# Patient Record
Sex: Female | Born: 1965 | Race: White | Hispanic: No | Marital: Married | Smoking: Never smoker
Health system: Southern US, Community
[De-identification: ages and names within clinical notes are randomized; demographics above are authoritative.]

## PROBLEM LIST (undated history)

## (undated) DIAGNOSIS — Z8742 Personal history of other diseases of the female genital tract: Secondary | ICD-10-CM

## (undated) DIAGNOSIS — D352 Benign neoplasm of pituitary gland: Secondary | ICD-10-CM

## (undated) DIAGNOSIS — N200 Calculus of kidney: Secondary | ICD-10-CM

## (undated) DIAGNOSIS — N920 Excessive and frequent menstruation with regular cycle: Secondary | ICD-10-CM

## (undated) DIAGNOSIS — K5792 Diverticulitis of intestine, part unspecified, without perforation or abscess without bleeding: Secondary | ICD-10-CM

## (undated) DIAGNOSIS — E221 Hyperprolactinemia: Secondary | ICD-10-CM

## (undated) HISTORY — DX: Calculus of kidney: N20.0

## (undated) HISTORY — DX: Diverticulitis of intestine, part unspecified, without perforation or abscess without bleeding: K57.92

---

## 1999-01-31 ENCOUNTER — Inpatient Hospital Stay (HOSPITAL_COMMUNITY): Admission: AD | Admit: 1999-01-31 | Discharge: 1999-01-31 | Payer: Self-pay | Admitting: *Deleted

## 1999-02-04 ENCOUNTER — Inpatient Hospital Stay (HOSPITAL_COMMUNITY): Admission: AD | Admit: 1999-02-04 | Discharge: 1999-02-06 | Payer: Self-pay | Admitting: *Deleted

## 2000-07-30 ENCOUNTER — Other Ambulatory Visit: Admission: RE | Admit: 2000-07-30 | Discharge: 2000-07-30 | Payer: Self-pay | Admitting: *Deleted

## 2001-08-04 ENCOUNTER — Other Ambulatory Visit: Admission: RE | Admit: 2001-08-04 | Discharge: 2001-08-04 | Payer: Self-pay | Admitting: *Deleted

## 2002-08-24 ENCOUNTER — Other Ambulatory Visit: Admission: RE | Admit: 2002-08-24 | Discharge: 2002-08-24 | Payer: Self-pay | Admitting: *Deleted

## 2003-11-01 ENCOUNTER — Other Ambulatory Visit: Admission: RE | Admit: 2003-11-01 | Discharge: 2003-11-01 | Payer: Self-pay | Admitting: *Deleted

## 2005-02-14 ENCOUNTER — Other Ambulatory Visit: Admission: RE | Admit: 2005-02-14 | Discharge: 2005-02-14 | Payer: Self-pay | Admitting: Obstetrics and Gynecology

## 2008-02-16 ENCOUNTER — Encounter: Payer: Self-pay | Admitting: Endocrinology

## 2008-06-25 ENCOUNTER — Encounter: Payer: Self-pay | Admitting: Endocrinology

## 2008-07-06 ENCOUNTER — Ambulatory Visit: Payer: Self-pay | Admitting: Endocrinology

## 2008-07-06 DIAGNOSIS — R51 Headache: Secondary | ICD-10-CM

## 2008-07-06 DIAGNOSIS — D352 Benign neoplasm of pituitary gland: Secondary | ICD-10-CM | POA: Insufficient documentation

## 2008-07-06 DIAGNOSIS — R87619 Unspecified abnormal cytological findings in specimens from cervix uteri: Secondary | ICD-10-CM

## 2008-07-06 DIAGNOSIS — E221 Hyperprolactinemia: Secondary | ICD-10-CM | POA: Insufficient documentation

## 2008-07-06 DIAGNOSIS — D353 Benign neoplasm of craniopharyngeal duct: Secondary | ICD-10-CM

## 2008-07-06 DIAGNOSIS — R519 Headache, unspecified: Secondary | ICD-10-CM | POA: Insufficient documentation

## 2008-07-06 LAB — CONVERTED CEMR LAB: TSH: 2.27 microintl units/mL (ref 0.35–5.50)

## 2008-07-09 ENCOUNTER — Encounter: Admission: RE | Admit: 2008-07-09 | Discharge: 2008-07-09 | Payer: Self-pay | Admitting: Endocrinology

## 2008-09-17 ENCOUNTER — Telehealth (INDEPENDENT_AMBULATORY_CARE_PROVIDER_SITE_OTHER): Payer: Self-pay | Admitting: *Deleted

## 2008-09-17 ENCOUNTER — Ambulatory Visit: Payer: Self-pay | Admitting: Endocrinology

## 2008-09-17 LAB — CONVERTED CEMR LAB: Prolactin: 47.4 ng/mL

## 2008-10-14 ENCOUNTER — Ambulatory Visit: Payer: Self-pay | Admitting: Endocrinology

## 2009-01-25 ENCOUNTER — Telehealth: Payer: Self-pay | Admitting: Endocrinology

## 2009-04-11 ENCOUNTER — Telehealth (INDEPENDENT_AMBULATORY_CARE_PROVIDER_SITE_OTHER): Payer: Self-pay | Admitting: *Deleted

## 2009-04-28 ENCOUNTER — Ambulatory Visit: Payer: Self-pay | Admitting: Endocrinology

## 2009-04-28 LAB — CONVERTED CEMR LAB: Prolactin: 29.3 ng/mL

## 2009-08-26 ENCOUNTER — Telehealth: Payer: Self-pay | Admitting: Endocrinology

## 2009-09-12 ENCOUNTER — Telehealth: Payer: Self-pay | Admitting: Endocrinology

## 2010-05-18 ENCOUNTER — Ambulatory Visit (HOSPITAL_BASED_OUTPATIENT_CLINIC_OR_DEPARTMENT_OTHER): Admission: RE | Admit: 2010-05-18 | Discharge: 2010-05-18 | Payer: Self-pay | Admitting: Surgery

## 2010-05-18 HISTORY — PX: UMBILICAL HERNIA REPAIR: SHX196

## 2010-09-10 ENCOUNTER — Encounter: Payer: Self-pay | Admitting: Endocrinology

## 2010-09-19 NOTE — Progress Notes (Signed)
Summary: Bromocriptine request  Phone Note Call from Patient Call back at Home Phone [phone]   Summary of Call: Patient left message on triage that she has new insurance and would like prescription for Bromocriptine sent to mail order [phone]). Patient was last seen in sept 2010 and told to f/u in one year. Ok to send? Initial call taken by: Lucious Groves,  August 26, 2009 9:58 AM  Follow-up for Phone Call        please refill x 1 year, 3 mos at a time.  please find out which mail order place this is, so we can send via emr. Follow-up by: Minus Breeding MD,  August 26, 2009 10:42 AM  Additional Follow-up for Phone Call Additional follow up Details #1::        Left message on machine to call back to office. Additional Follow-up by: Lucious Groves,  August 26, 2009 11:16 AM    Additional Follow-up for Phone Call Additional follow up Details #2::    Called patient home and was notified that the patient is on a cruise and will not be back until this friday. RX printed and awaiting signature in file folder at [employer] (MD is gone for the nesxt two weeks) Follow-up by: Lucious Groves,  August 29, 2009 10:22 AM  Additional Follow-up for Phone Call Additional follow up Details #3:: Details for Additional Follow-up Action Taken: i signed Additional Follow-up by: Minus Breeding MD,  September 12, 2009 12:54 PM  Prescriptions: PARLODEL 5 MG CAPS (BROMOCRIPTINE MESYLATE) bid  #180 x 3   Entered by:   Lucious Groves   Authorized by:   Minus Breeding MD   Signed by:   Lucious Groves on 08/29/2009   Method used:   Printed then faxed to ...       [facility]  [place] [address] (retail)       [address]       [place], [place]  [address]       Ph: [phone] or [phone]       Fax: [phone]   RxID:   [id number]   Appended Document: Bromocriptine request Rx was signed by MD. Arneta Cliche.

## 2010-09-19 NOTE — Progress Notes (Signed)
Summary: Parlodel RX  Phone Note Outgoing Call   Summary of Call: PArlodel prescription did not go successfully. [phone] is new prescription phone line for [facility]. Found in EMR and re-faxed electronically. Initial call taken by: Lucious Groves,  September 12, 2009 2:52 PM    New/Updated Medications: PARLODEL 5 MG CAPS (BROMOCRIPTINE MESYLATE) bid Prescriptions: PARLODEL 5 MG CAPS (BROMOCRIPTINE MESYLATE) bid  #180 x 3   Entered by:   Lucious Groves   Authorized by:   Minus Breeding MD   Signed by:   Lucious Groves on 09/12/2009   Method used:   Re-Faxed to ...       [facility] ([facility]order)       [address] [place], [place]  [address]       Ph: [phone]       Fax: [phone]   RxID:   [id number]

## 2010-11-02 LAB — DIFFERENTIAL
Basophils Relative: 1 % (ref 0–1)
Monocytes Relative: 6 % (ref 3–12)
Neutro Abs: 3.2 10*3/uL (ref 1.7–7.7)
Neutrophils Relative %: 55 % (ref 43–77)

## 2010-11-02 LAB — BASIC METABOLIC PANEL
Calcium: 8.9 mg/dL (ref 8.4–10.5)
Creatinine, Ser: 0.8 mg/dL (ref 0.4–1.2)
GFR calc non Af Amer: >60 mL/min (ref >60)
Glucose, Bld: 89 mg/dL (ref 70–99)
Sodium: 139 mEq/L (ref 135–145)

## 2010-11-02 LAB — CBC
Hemoglobin: 12.6 g/dL (ref 12.0–15.0)
MCHC: 33.6 g/dL (ref 30.0–36.0)
Platelets: 243 10*3/uL (ref 150–400)
RBC: 4.17 MIL/uL (ref 3.87–5.11)

## 2010-12-20 ENCOUNTER — Other Ambulatory Visit: Payer: Self-pay | Admitting: Endocrinology

## 2010-12-22 ENCOUNTER — Other Ambulatory Visit: Payer: Self-pay

## 2010-12-22 NOTE — Telephone Encounter (Signed)
A user error has taken place: encounter opened in error, closed for administrative reasons.

## 2010-12-29 ENCOUNTER — Ambulatory Visit: Payer: Self-pay | Admitting: Endocrinology

## 2011-01-03 ENCOUNTER — Ambulatory Visit (INDEPENDENT_AMBULATORY_CARE_PROVIDER_SITE_OTHER): Payer: BC Managed Care – PPO | Admitting: Endocrinology

## 2011-01-03 ENCOUNTER — Other Ambulatory Visit (INDEPENDENT_AMBULATORY_CARE_PROVIDER_SITE_OTHER): Payer: BC Managed Care – PPO

## 2011-01-03 ENCOUNTER — Encounter: Payer: Self-pay | Admitting: Endocrinology

## 2011-01-03 DIAGNOSIS — Z79899 Other long term (current) drug therapy: Secondary | ICD-10-CM | POA: Insufficient documentation

## 2011-01-03 DIAGNOSIS — E229 Hyperfunction of pituitary gland, unspecified: Secondary | ICD-10-CM

## 2011-01-03 DIAGNOSIS — D353 Benign neoplasm of craniopharyngeal duct: Secondary | ICD-10-CM

## 2011-01-03 DIAGNOSIS — D352 Benign neoplasm of pituitary gland: Secondary | ICD-10-CM

## 2011-01-03 LAB — TSH: TSH: 0.85 u[IU]/mL (ref 0.35–5.50)

## 2011-01-03 LAB — BASIC METABOLIC PANEL
CO2: 25 mEq/L (ref 19–32)
Calcium: 9.4 mg/dL (ref 8.4–10.5)
Creatinine, Ser: 0.7 mg/dL (ref 0.4–1.2)
GFR: 101.86 mL/min (ref >60.00)
Sodium: 137 mEq/L (ref 135–145)

## 2011-01-03 LAB — PROLACTIN: Prolactin: 50.7 ng/mL

## 2011-01-03 NOTE — Progress Notes (Signed)
  Subjective:    Patient ID: Tina Martin, female    DOB: Feb 01, 1966, 45 y.o.   MRN: [id number]  HPI Pt returns for f/u of pituitary adenoma and hyperprolactinemia (uncertain relationship between the two).  pt states she feels well in general.  She has reg menses.  She ran out of parlodel 1 week ago.   Past Medical History  Diagnosis Date  . PITUITARY ADENOMA 07/06/2008  . HYPERPROLACTINEMIA 07/06/2008  . Headache 07/06/2008  . PAP SMEAR, ABNORMAL 07/06/2008    No past surgical history on file.  History   Social History  . Marital Status: Married    Spouse Name: N/A    Number of Children: N/A  . Years of Education: N/A   Occupational History  . Facilities manager   Social History Main Topics  . Smoking status: Never Smoker   . Smokeless tobacco: Not on file  . Alcohol Use: Not on file  . Drug Use: Not on file  . Sexually Active: Not on file   Other Topics Concern  . 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    No Known Allergies  No family history on file.  BP 118/76  Pulse 62  Temp(Src) 98 F (36.7 C) (Oral)  Ht 5\' 3"  (1.6 m)  Wt 134 lb (60.782 kg)  BMI 23.74 kg/m2  SpO2 97%   Review of Systems Denies headache.    Objective:   Physical Exam GENERAL: no distress Neck - No masses or thyromegaly or limitation in range of motion.    Prolactin=50   Assessment & Plan:  Hyperprolactinemia, slightly higher off parlodel. Pituitary adenoma, small

## 2011-01-03 NOTE — Patient Instructions (Addendum)
blood tests and a repeat mri, are being ordered for you today.  please call [phone] to hear each of your test results.  You will be prompted to enter the 9-digit "MRN" number that appears at the top left of this page, followed by #.  Then you will hear the message. Why don't you stay-off the bromocriptine for now.  Please call if you have symptoms (no menstruation, or leakage from the breasts). Please return in 1 year (update: i left message on phone-tree:  rx as we discussed)

## 2011-01-08 ENCOUNTER — Other Ambulatory Visit: Payer: BC Managed Care – PPO

## 2011-01-10 ENCOUNTER — Ambulatory Visit
Admission: RE | Admit: 2011-01-10 | Discharge: 2011-01-10 | Disposition: A | Discharge status: Home / Self Care | Payer: BC Managed Care – PPO | Source: Ambulatory Visit | Attending: Endocrinology | Admitting: Endocrinology

## 2011-01-10 DIAGNOSIS — D352 Benign neoplasm of pituitary gland: Secondary | ICD-10-CM

## 2011-01-10 MED ORDER — GADOBENATE DIMEGLUMINE 529 MG/ML IV SOLN
9.0000 mL | Freq: Once | INTRAVENOUS | Status: AC | PRN
  Administered 2011-01-10: 9 mL via INTRAVENOUS

## 2011-01-11 ENCOUNTER — Other Ambulatory Visit: Payer: BC Managed Care – PPO

## 2011-02-19 ENCOUNTER — Telehealth: Payer: Self-pay

## 2011-02-19 MED ORDER — BROMOCRIPTINE MESYLATE 2.5 MG PO TABS: 2.5000 mg | ORAL_TABLET | Freq: Two times a day (BID) | ORAL | Status: DC

## 2011-02-19 NOTE — Telephone Encounter (Signed)
Pt informed of Rx/pharmacy 

## 2011-02-19 NOTE — Telephone Encounter (Signed)
Pt called stating she has been experiencing sxs since being off of Bromocriptine and would like to re-start medication, please advise.

## 2011-02-19 NOTE — Telephone Encounter (Signed)
i sent rx for 2.5 mg bid.  To avoid side-effects (nausea, dizziness), start 1/2 tab qhs x a few days.  Then 1 tab qhs, then 1 tab bid).

## 2011-06-15 ENCOUNTER — Other Ambulatory Visit: Payer: Self-pay | Admitting: Obstetrics and Gynecology

## 2011-06-15 DIAGNOSIS — R928 Other abnormal and inconclusive findings on diagnostic imaging of breast: Secondary | ICD-10-CM

## 2011-07-02 ENCOUNTER — Ambulatory Visit
Admission: RE | Admit: 2011-07-02 | Discharge: 2011-07-02 | Disposition: A | Discharge status: Home / Self Care | Payer: BC Managed Care – PPO | Source: Ambulatory Visit | Attending: Obstetrics and Gynecology | Admitting: Obstetrics and Gynecology

## 2011-07-02 DIAGNOSIS — R928 Other abnormal and inconclusive findings on diagnostic imaging of breast: Secondary | ICD-10-CM

## 2012-03-13 ENCOUNTER — Encounter: Payer: Self-pay | Admitting: Endocrinology

## 2012-03-13 ENCOUNTER — Ambulatory Visit (INDEPENDENT_AMBULATORY_CARE_PROVIDER_SITE_OTHER): Payer: BC Managed Care – PPO | Admitting: Endocrinology

## 2012-03-13 ENCOUNTER — Other Ambulatory Visit (INDEPENDENT_AMBULATORY_CARE_PROVIDER_SITE_OTHER): Payer: BC Managed Care – PPO

## 2012-03-13 VITALS — BP 102/72 | HR 71 | Temp 97.3°F | Wt 140.0 lb

## 2012-03-13 DIAGNOSIS — E229 Hyperfunction of pituitary gland, unspecified: Secondary | ICD-10-CM

## 2012-03-13 DIAGNOSIS — Z79899 Other long term (current) drug therapy: Secondary | ICD-10-CM

## 2012-03-13 LAB — BASIC METABOLIC PANEL
CO2: 27 mEq/L (ref 19–32)
Calcium: 9.3 mg/dL (ref 8.4–10.5)
Creatinine, Ser: 0.7 mg/dL (ref 0.4–1.2)
GFR: 90.33 mL/min (ref >60.00)
Sodium: 138 mEq/L (ref 135–145)

## 2012-03-13 LAB — HEPATIC FUNCTION PANEL
Alkaline Phosphatase: 48 U/L (ref 39–117)
Bilirubin, Direct: 0 mg/dL (ref 0.0–0.3)
Total Bilirubin: 0.4 mg/dL (ref 0.3–1.2)

## 2012-03-13 LAB — PROLACTIN: Prolactin: 39.8 ng/mL

## 2012-03-13 LAB — TSH: TSH: 2.24 u[IU]/mL (ref 0.35–5.50)

## 2012-03-13 NOTE — Patient Instructions (Addendum)
blood tests are being requested for you today.  You will receive a letter with results. Please return in 1 year. 

## 2012-03-13 NOTE — Progress Notes (Signed)
  Subjective:    Patient ID: Tina Martin, female    DOB: 07-29-1966, 46 y.o.   MRN: [id number]  HPI Pt returns for f/u of pituitary adenoma and hyperprolactinemia (uncertain relationship between the two).  pt states she feels well in general.  She has reg menses.  Back on the parlodel, no further headache.   Past Medical History  Diagnosis Date  . PITUITARY ADENOMA 07/06/2008  . HYPERPROLACTINEMIA 07/06/2008  . Headache 07/06/2008  . PAP SMEAR, ABNORMAL 07/06/2008    No past surgical history on file.  History   Social History  . Marital Status: Married    Spouse Name: N/A    Number of Children: N/A  . Years of Education: N/A   Occupational History  . Facilities manager   Social History Main Topics  . Smoking status: Never Smoker   . Smokeless tobacco: Not on file  . Alcohol Use: Not on file  . Drug Use: Not on file  . Sexually Active: Not on file   Other Topics Concern  . 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 mometasone (NASONEX) 50 MCG/ACT nasal spray Place 2 sprays into the nose daily.      . montelukast (SINGULAIR) 10 MG tablet Take 10 mg by mouth daily.      Marland Kitchen spironolactone (ALDACTONE) 25 MG tablet Take 25 mg by mouth daily.      . bromocriptine (PARLODEL) 2.5 MG tablet Take 2 tablets (5 mg total) by mouth 2 (two) times daily.  120 tablet  11   Allergies  Allergen Reactions  . Minocycline    No family history on file.  BP 102/72  Pulse 71  Temp 97.3 F (36.3 C) (Oral)  Wt 140 lb (63.504 kg)  SpO2 96%  LMP 02/21/2012  Review of Systems Denies galactorrhea    Objective:   Physical Exam VITAL SIGNS:  See vs page GENERAL: no distress NECK: There is no palpable thyroid enlargement.  No thyroid nodule is palpable.  No palpable lymphadenopathy at the anterior neck.     Prolactin=39    Assessment & Plan:  Hyperprolactinemia, needs increased rx Pituitary microadenoma has been stable, so i don't think she needs a repeat this year.

## 2012-03-14 ENCOUNTER — Encounter: Payer: Self-pay | Admitting: Endocrinology

## 2012-03-14 MED ORDER — BROMOCRIPTINE MESYLATE 2.5 MG PO TABS: 5.0000 mg | ORAL_TABLET | Freq: Two times a day (BID) | ORAL | Status: DC

## 2012-03-21 ENCOUNTER — Other Ambulatory Visit: Payer: Self-pay | Admitting: Endocrinology

## 2013-02-12 ENCOUNTER — Ambulatory Visit (INDEPENDENT_AMBULATORY_CARE_PROVIDER_SITE_OTHER): Payer: BC Managed Care – PPO | Admitting: Endocrinology

## 2013-02-12 ENCOUNTER — Encounter: Payer: Self-pay | Admitting: Endocrinology

## 2013-02-12 VITALS — BP 126/70 | HR 77 | Ht 62.0 in | Wt 148.0 lb

## 2013-02-12 DIAGNOSIS — E229 Hyperfunction of pituitary gland, unspecified: Secondary | ICD-10-CM

## 2013-02-12 DIAGNOSIS — D353 Benign neoplasm of craniopharyngeal duct: Secondary | ICD-10-CM

## 2013-02-12 LAB — PROLACTIN: Prolactin: 75 ng/mL

## 2013-02-12 NOTE — Patient Instructions (Addendum)
blood tests are being requested for you today.  We'll contact you with results. Whether or not you should have the MRI recheked is a borderline call.  Please let me know if you decide to have it done. Please return in 1 year.

## 2013-02-12 NOTE — Progress Notes (Signed)
  Subjective:    Patient ID: Tina Martin, female    DOB: [birth year], 47 y.o.   MRN: [id number]  HPI Pt returns for f/u of pituitary microadenoma and hyperprolactinemia (dx'ed 2009; uncertain relationship between the two).  pt states she feels well in general.  Her husband has had a vasectomy.  She has reg menses.  Back on the parlodel, she has no further headache.   Past Medical History  Diagnosis Date  . PITUITARY ADENOMA 07/06/2008  . HYPERPROLACTINEMIA 07/06/2008  . Headache(784.0) 07/06/2008  . PAP SMEAR, ABNORMAL 07/06/2008    No past surgical history on file.  History   Social History  . Marital Status: Married    Spouse Name: N/A    Number of Children: N/A  . Years of Education: N/A   Occupational History  . Facilities manager   Social History Main Topics  . Smoking status: Never Smoker   . Smokeless tobacco: Not on file  . Alcohol Use: Not on file  . Drug Use: Not on file  . Sexually Active: Not on file   Other Topics Concern  . 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 bromocriptine (PARLODEL) 2.5 MG tablet Take 2 tablets (5 mg total) by mouth 2 (two) times daily.  120 tablet  11  . mometasone (NASONEX) 50 MCG/ACT nasal spray Place 2 sprays into the nose daily.      . montelukast (SINGULAIR) 10 MG tablet Take 10 mg by mouth daily.      Marland Kitchen spironolactone (ALDACTONE) 25 MG tablet Take 25 mg by mouth daily.       No current facility-administered medications on file prior to visit.    Allergies  Allergen Reactions  . Minocycline     No family history on file.  BP 126/70  Pulse 77  Ht 5\' 2"  (1.575 m)  Wt 148 lb (67.132 kg)  BMI 27.06 kg/m2  SpO2 98%  Review of Systems Denies visual loss    Objective:   Physical Exam VITAL SIGNS:  See vs page GENERAL: no  distress NECK: There is no palpable thyroid enlargement.  No thyroid nodule is palpable.  No palpable lymphadenopathy at the anterior neck.     Assessment & Plan:  Hyperprolactinemia, on rx.

## 2013-03-30 ENCOUNTER — Other Ambulatory Visit: Payer: Self-pay | Admitting: *Deleted

## 2013-03-30 MED ORDER — BROMOCRIPTINE MESYLATE 2.5 MG PO TABS: 5.0000 mg | ORAL_TABLET | Freq: Two times a day (BID) | ORAL | Status: DC

## 2013-08-31 ENCOUNTER — Ambulatory Visit: Payer: BC Managed Care – PPO | Admitting: Endocrinology

## 2013-09-03 ENCOUNTER — Ambulatory Visit: Payer: BC Managed Care – PPO | Admitting: Endocrinology

## 2013-09-04 ENCOUNTER — Ambulatory Visit (INDEPENDENT_AMBULATORY_CARE_PROVIDER_SITE_OTHER): Payer: PRIVATE HEALTH INSURANCE | Admitting: Endocrinology

## 2013-09-04 VITALS — BP 112/78 | HR 90 | Temp 98.1°F

## 2013-09-04 DIAGNOSIS — E229 Hyperfunction of pituitary gland, unspecified: Secondary | ICD-10-CM

## 2013-09-04 DIAGNOSIS — D352 Benign neoplasm of pituitary gland: Secondary | ICD-10-CM

## 2013-09-04 DIAGNOSIS — D353 Benign neoplasm of craniopharyngeal duct: Secondary | ICD-10-CM

## 2013-09-04 DIAGNOSIS — Z79899 Other long term (current) drug therapy: Secondary | ICD-10-CM

## 2013-09-04 LAB — BASIC METABOLIC PANEL
BUN: 14 mg/dL (ref 6–23)
CALCIUM: 9.1 mg/dL (ref 8.4–10.5)
CO2: 26 mEq/L (ref 19–32)
Chloride: 102 mEq/L (ref 96–112)
Creatinine, Ser: 1 mg/dL (ref 0.4–1.2)
GFR: 65.66 mL/min (ref >60.00)
GLUCOSE: 104 mg/dL — AB (ref 70–99)
Potassium: 3.6 mEq/L (ref 3.5–5.1)
Sodium: 136 mEq/L (ref 135–145)

## 2013-09-04 LAB — TSH: TSH: 1.44 u[IU]/mL (ref 0.35–5.50)

## 2013-09-04 NOTE — Progress Notes (Signed)
   Subjective:    Patient ID: Tina Martin, female    DOB: October 20, 1965, 48 y.o.   MRN: [id number]  HPI Pt returns for f/u of pituitary microadenoma and hyperprolactinemia (dx'ed 4259; uncertain relationship between the two).  pt states she feels well in general.  Her husband has had a vasectomy.  She has reg menses.  Last MRI was in 2012, which showed a 6x10x8 mm microadenoma, unchanged.  She was off parlodel from 2012-2013, but has been back on since then.  Today, pt states few mos of slight pain at the roof of the mouth, and assoc headache.   Past Medical History  Diagnosis Date  . PITUITARY ADENOMA 07/06/2008  . HYPERPROLACTINEMIA 07/06/2008  . Headache(784.0) 07/06/2008  . PAP SMEAR, ABNORMAL 07/06/2008    No past surgical history on file.  History   Social History  . Marital Status: Married    Spouse Name: N/A    Number of Children: N/A  . Years of Education: N/A   Occupational History  . Energy manager   Social History Main Topics  . Smoking status: Never Smoker   . Smokeless tobacco: Not on file  . Alcohol Use: Not on file  . Drug Use: Not on file  . Sexual Activity: Not on file   Other Topics Concern  . 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 bromocriptine (PARLODEL) 2.5 MG tablet Take 2 tablets (5 mg total) by mouth 2 (two) times daily.  120 tablet  5  . spironolactone (ALDACTONE) 25 MG tablet Take 25 mg by mouth daily.      . mometasone (NASONEX) 50 MCG/ACT nasal spray Place 2 sprays into the nose daily.      . montelukast (SINGULAIR) 10 MG tablet Take 10 mg by mouth daily.       No current facility-administered medications on file prior to visit.    Allergies  Allergen Reactions  . Minocycline     No family history on file.  BP 112/78  Pulse 90  Temp(Src) 98.1 F  (36.7 C) (Oral)  SpO2 97%  Review of Systems She has reg menses.  Denies visual loss.      Objective:   Physical Exam VITAL SIGNS:  See vs page GENERAL: no distress NECK: There is no palpable thyroid enlargement.  No thyroid nodule is palpable.  No palpable lymphadenopathy at the anterior neck.       Assessment & Plan:  Headache, recurrent, uncertain etiology Hyperprolactinemia: if it persists, she should change rx to cabergoline. Pituitary microadenoma: uncertain relationship to headache.

## 2013-09-04 NOTE — Patient Instructions (Addendum)
blood tests are being requested for you today.  We'll contact you with results. If the prolactin is still high, let's try changing bromocriptine to "cabergoline."  Let's also check the MRI.  you will receive a phone call, about a day and time for an appointment.   Please return in 1 year.

## 2013-09-05 LAB — PROLACTIN: Prolactin: 45.7 ng/mL

## 2013-09-30 ENCOUNTER — Ambulatory Visit
Admission: RE | Admit: 2013-09-30 | Discharge: 2013-09-30 | Disposition: A | Discharge status: Home / Self Care | Payer: PRIVATE HEALTH INSURANCE | Source: Ambulatory Visit | Attending: Endocrinology | Admitting: Endocrinology

## 2013-09-30 DIAGNOSIS — D352 Benign neoplasm of pituitary gland: Secondary | ICD-10-CM

## 2013-09-30 DIAGNOSIS — D353 Benign neoplasm of craniopharyngeal duct: Principal | ICD-10-CM

## 2013-09-30 MED ORDER — GADOBENATE DIMEGLUMINE 529 MG/ML IV SOLN
7.0000 mL | Freq: Once | INTRAVENOUS | Status: AC | PRN
  Administered 2013-09-30: 7 mL via INTRAVENOUS

## 2013-10-20 ENCOUNTER — Telehealth: Payer: Self-pay | Admitting: Endocrinology

## 2013-10-20 NOTE — Telephone Encounter (Signed)
Pt states she is waiting on MRI results   Call Back: [phone]

## 2013-10-27 ENCOUNTER — Other Ambulatory Visit: Payer: Self-pay | Admitting: Endocrinology

## 2014-05-21 ENCOUNTER — Other Ambulatory Visit: Payer: Self-pay | Admitting: Endocrinology

## 2014-08-25 ENCOUNTER — Encounter: Payer: Self-pay | Admitting: Endocrinology

## 2014-08-25 ENCOUNTER — Ambulatory Visit (INDEPENDENT_AMBULATORY_CARE_PROVIDER_SITE_OTHER): Payer: PRIVATE HEALTH INSURANCE | Admitting: Endocrinology

## 2014-08-25 VITALS — BP 122/80 | HR 72 | Temp 98.6°F | Ht 62.0 in | Wt 157.0 lb

## 2014-08-25 DIAGNOSIS — D353 Benign neoplasm of craniopharyngeal duct: Principal | ICD-10-CM

## 2014-08-25 DIAGNOSIS — D352 Benign neoplasm of pituitary gland: Secondary | ICD-10-CM

## 2014-08-25 DIAGNOSIS — E221 Hyperprolactinemia: Secondary | ICD-10-CM

## 2014-08-25 DIAGNOSIS — E559 Vitamin D deficiency, unspecified: Secondary | ICD-10-CM

## 2014-08-25 LAB — BASIC METABOLIC PANEL
BUN: 15 mg/dL (ref 6–23)
CO2: 23 meq/L (ref 19–32)
Calcium: 9.3 mg/dL (ref 8.4–10.5)
Chloride: 102 mEq/L (ref 96–112)
Creatinine, Ser: 0.8 mg/dL (ref 0.4–1.2)
GFR: 77.19 mL/min (ref >60.00)
Glucose, Bld: 97 mg/dL (ref 70–99)
POTASSIUM: 4.3 meq/L (ref 3.5–5.1)
Sodium: 134 mEq/L — ABNORMAL LOW (ref 135–145)

## 2014-08-25 LAB — T4, FREE: Free T4: 0.71 ng/dL (ref 0.60–1.60)

## 2014-08-25 LAB — TSH: TSH: 2.38 u[IU]/mL (ref 0.35–4.50)

## 2014-08-25 LAB — VITAMIN D 25 HYDROXY (VIT D DEFICIENCY, FRACTURES): VITD: 19.94 ng/mL — AB (ref 30.00–100.00)

## 2014-08-25 MED ORDER — VITAMIN D (ERGOCALCIFEROL) 1.25 MG (50000 UNIT) PO CAPS: 50000.0000 [IU] | ORAL_CAPSULE | ORAL | Status: DC

## 2014-08-25 NOTE — Progress Notes (Signed)
   Subjective:    Patient ID: Tina Martin, female    DOB: October 09, 1965, 49 y.o.   MRN: [id number]  HPI Pt returns for f/u of pituitary microadenoma and hyperprolactinemia (dx'ed 4174; uncertain relationship between the two; her husband has had a vasectomy). Last MRI was in 2015, which showed only minimal tumor growth).  She takes parlodel as rx'ed.  Chief complaint is difficulty losing weight.  She has normal menses.  Denies cramps and numbness.  Past Medical History  Diagnosis Date  . PITUITARY ADENOMA 07/06/2008  . HYPERPROLACTINEMIA 07/06/2008  . Headache(784.0) 07/06/2008  . PAP SMEAR, ABNORMAL 07/06/2008    No past surgical history on file.  History   Social History  . Marital Status: Married    Spouse Name: N/A    Number of Children: N/A  . Years of Education: N/A   Occupational History  . Energy manager   Social History Main Topics  . Smoking status: Never Smoker   . Smokeless tobacco: Not on file  . Alcohol Use: Not on file  . Drug Use: Not on file  . Sexual Activity: Not on file   Other Topics Concern  . 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 mometasone (NASONEX) 50 MCG/ACT nasal spray Place 2 sprays into the nose daily.    . montelukast (SINGULAIR) 10 MG tablet Take 10 mg by mouth daily.    Marland Kitchen spironolactone (ALDACTONE) 25 MG tablet Take 25 mg by mouth daily.     No current facility-administered medications on file prior to visit.    Allergies  Allergen Reactions  . Minocycline     No family history on file.  BP 122/80 mmHg  Pulse 72  Temp(Src) 98.6 F (37 C) (Oral)  Ht 5\' 2"  (1.575 m)  Wt 157 lb (71.215 kg)  BMI 28.71 kg/m2  SpO2 97%   Review of Systems Denies galactorrhea and headache.     Objective:   Physical Exam VITAL SIGNS:  See vs  page GENERAL: no distress NECK: There is no palpable thyroid enlargement.  No thyroid nodule is palpable.  No palpable lymphadenopathy at the anterior neck.   Ext: no edema.    Prolactin=70    Assessment & Plan:  Hyperprolactinemia: she head injury Pituitary adenoma; minimal change last year.   Patient is advised the following: Patient Instructions  blood tests are being requested for you today.  We'll let you know about the results.  If the prolactin is still high, let's try changing bromocriptine to "cabergoline."  We can skip the MRI this year, as it was only minimally changed last year.   Please return in 1 year.

## 2014-08-25 NOTE — Patient Instructions (Signed)
blood tests are being requested for you today.  We'll let you know about the results.  If the prolactin is still high, let's try changing bromocriptine to "cabergoline."  We can skip the MRI this year, as it was only minimally changed last year.   Please return in 1 year.

## 2014-08-26 LAB — PROLACTIN: PROLACTIN: 70.2 ng/mL

## 2014-08-26 MED ORDER — CABERGOLINE 0.5 MG PO TABS: 0.2500 mg | ORAL_TABLET | ORAL | Status: DC

## 2015-01-26 ENCOUNTER — Ambulatory Visit (INDEPENDENT_AMBULATORY_CARE_PROVIDER_SITE_OTHER): Payer: PRIVATE HEALTH INSURANCE | Admitting: Physician Assistant

## 2015-01-26 VITALS — BP 110/74 | HR 90 | Temp 98.9°F | Resp 16 | Ht 63.0 in | Wt 147.0 lb

## 2015-01-26 DIAGNOSIS — S93401A Sprain of unspecified ligament of right ankle, initial encounter: Secondary | ICD-10-CM | POA: Diagnosis not present

## 2015-01-26 MED ORDER — MELOXICAM 15 MG PO TABS: 15.0000 mg | ORAL_TABLET | Freq: Every day | ORAL | Status: DC

## 2015-01-26 NOTE — Progress Notes (Signed)
[facility] [address], [place] [address] [phone]  Date:  01/26/2015   Name:  Tina Martin   DOB:  [birth year]   MRN:  [id number]  PCP:  Stephens Shire, MD    Chief Complaint: Ankle Injury   History of Present Illness:  This is a 49 y.o. female with PMH pituitary adenoma and vit D def who is presenting with injury to right ankle. 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 She reports she initially got up and was able to walk around fine. 30 minutes later she started getting a shooting pain at her lateral ankle with bearing weight. The pain is better with rest. She reports she twisted her ankle 1 year ago and recovered well. She denies paresthesias, weakness or decreased movement. She has only tried ice for symptoms so far and seems to be helping.   Review of Systems:  Review of Systems  Constitutional: Negative for fever and chills.  Musculoskeletal: Positive for arthralgias.  Skin: Negative for color change and rash.  Neurological: Negative for weakness and numbness.   Patient Active Problem List   Diagnosis Date Noted  . Vitamin D deficiency 08/25/2014  . Encounter for long-term (current) use of other medications 01/03/2011  . PITUITARY ADENOMA 07/06/2008  . Hyperprolactinemia 07/06/2008  . HEADACHE 07/06/2008  . PAP SMEAR, ABNORMAL 07/06/2008    Prior to Admission medications   Medication Sig Start Date End Date Taking? Authorizing Provider  cabergoline (DOSTINEX) 0.5 MG tablet Take 0.5 tablets (0.25 mg total) by mouth 2 (two) times a week. 08/26/14  Yes Renato Shin, MD    Allergies  Allergen Reactions  . Minocycline     Past Surgical History  Procedure Laterality Date  . Hernia repair      History  Substance Use Topics  . Smoking status: Never Smoker   . Smokeless tobacco: Not on file  . Alcohol Use: Not on file    Family History  Problem Relation Age of Onset  . Hypertension  Mother   . Hyperlipidemia Mother     Medication list has been reviewed and updated.  Physical Examination:  Physical Exam  Constitutional: She is oriented to person, place, and time. She appears well-developed and well-nourished. No distress.  HENT:  Head: Normocephalic and atraumatic.  Right Ear: Hearing normal.  Left Ear: Hearing normal.  Nose: Nose normal.  Eyes: Conjunctivae and lids are normal. Right eye exhibits no discharge. Left eye exhibits no discharge. No scleral icterus.  Cardiovascular: Normal rate, regular rhythm and normal pulses.   Pulmonary/Chest: Effort normal and breath sounds normal. No respiratory distress.  Musculoskeletal: Normal range of motion.       Right ankle: She exhibits normal range of motion, no swelling, no ecchymosis and normal pulse. Tenderness (just inferior to lateral malleolus). No lateral malleolus, no medial malleolus, no head of 5th metatarsal and no proximal fibula tenderness found. Achilles tendon normal.       Left ankle: Normal.       Right foot: There is normal range of motion, no tenderness, no bony tenderness, no swelling and normal capillary refill.       Left foot: Normal.  Neurological: She is alert and oriented to person, place, and time. She has normal strength. No sensory deficit. Gait (antalgic) abnormal.  Pt able to bear weight but with pain  Skin: Skin is warm, dry and intact. No lesion and no rash  noted.  Psychiatric: She has a normal mood and affect. Her speech is normal and behavior is normal. Thought content normal.   BP 110/74 mmHg  Pulse 90  Temp(Src) 98.9 F (37.2 C) (Oral)  Resp 16  Ht 5\' 3"  (1.6 m)  Wt 147 lb (66.679 kg)  BMI 26.05 kg/m2  SpO2 98%  LMP 01/12/2015  Ot[place] ankle score: 0  Assessment and Plan:  1. Sprain of ankle, right, initial encounter Pt has likely sprained right ankle. No radiograph as ottawa ankle score 0. Pt fitted for tall boot. She will wear boot for next 1 week and then will graduate  to supportive shoe. If still having significant pain and difficulty bearing weight in 1 week, she will return. Mobic prescribed. Counseled on RICE. - meloxicam (MOBIC) 15 MG tablet; Take 1 tablet (15 mg total) by mouth daily.  Dispense: 30 tablet; Refill: 0   Benjaman Pott. Drenda Freeze, MHS [facility]  01/26/2015

## 2015-01-26 NOTE — Patient Instructions (Signed)
Wear boot at all times while ambulating for next 1 week. Ice, rest, elevate. In one week if unable to bear weight without boot on, return. In one week, you may stop wearing boot and start wearing a supportive shoe at all times for next 1 week. Take mobic once a day for pain/inflammation. You may take tylenol with this but no products containing ibuprofen, napryosyn or aspirin.    RICE: Routine Care for Injuries The routine care of many injuries includes Rest, Ice, Compression, and Elevation (RICE). HOME CARE INSTRUCTIONS  Rest is needed to allow your body to heal. Routine activities can usually be resumed when comfortable. Injured tendons and bones can take up to 6 weeks to heal. Tendons are the cord-like structures that attach muscle to bone.  Ice following an injury helps keep the swelling down and reduces pain.  Put ice in a plastic bag.  Place a towel between your skin and the bag.  Leave the ice on for 15-20 minutes, 3-4 times a day, or as directed by your health care provider. Do this while awake, for the first 24 to 48 hours. After that, continue as directed by your caregiver.  Compression helps keep swelling down. It also gives support and helps with discomfort. If an elastic bandage has been applied, it should be removed and reapplied every 3 to 4 hours. It should not be applied tightly, but firmly enough to keep swelling down. Watch fingers or toes for swelling, bluish discoloration, coldness, numbness, or excessive pain. If any of these problems occur, remove the bandage and reapply loosely. Contact your caregiver if these problems continue.  Elevation helps reduce swelling and decreases pain. With extremities, such as the arms, hands, legs, and feet, the injured area should be placed near or above the level of the heart, if possible. SEEK IMMEDIATE MEDICAL CARE IF:  You have persistent pain and swelling.  You develop redness, numbness, or unexpected weakness.  Your symptoms  are getting worse rather than improving after several days. These symptoms may indicate that further evaluation or further X-rays are needed. Sometimes, X-rays may not show a small broken bone (fracture) until 1 week or 10 days later. Make a follow-up appointment with your caregiver. Ask when your X-ray results will be ready. Make sure you get your X-ray results. Document Released: 11/18/2000 Document Revised: 08/11/2013 Document Reviewed: 01/05/2011 [facility] [place] Patient Information 2015 [place], [place]. This information is not intended to replace advice given to you by your health care provider. Make sure you discuss any questions you have with your health care provider.

## 2015-04-01 ENCOUNTER — Encounter (HOSPITAL_BASED_OUTPATIENT_CLINIC_OR_DEPARTMENT_OTHER): Payer: Self-pay | Admitting: *Deleted

## 2015-04-06 ENCOUNTER — Encounter (HOSPITAL_BASED_OUTPATIENT_CLINIC_OR_DEPARTMENT_OTHER): Payer: Self-pay | Admitting: *Deleted

## 2015-04-06 DIAGNOSIS — R51 Headache: Secondary | ICD-10-CM | POA: Diagnosis not present

## 2015-04-06 DIAGNOSIS — D352 Benign neoplasm of pituitary gland: Secondary | ICD-10-CM | POA: Diagnosis not present

## 2015-04-06 DIAGNOSIS — N840 Polyp of corpus uteri: Secondary | ICD-10-CM | POA: Diagnosis not present

## 2015-04-06 DIAGNOSIS — E221 Hyperprolactinemia: Secondary | ICD-10-CM | POA: Diagnosis not present

## 2015-04-06 DIAGNOSIS — Z91040 Latex allergy status: Secondary | ICD-10-CM | POA: Diagnosis not present

## 2015-04-06 DIAGNOSIS — D759 Disease of blood and blood-forming organs, unspecified: Secondary | ICD-10-CM | POA: Diagnosis not present

## 2015-04-06 DIAGNOSIS — N920 Excessive and frequent menstruation with regular cycle: Secondary | ICD-10-CM | POA: Diagnosis present

## 2015-04-06 DIAGNOSIS — Z881 Allergy status to other antibiotic agents status: Secondary | ICD-10-CM | POA: Diagnosis not present

## 2015-04-06 DIAGNOSIS — D649 Anemia, unspecified: Secondary | ICD-10-CM | POA: Diagnosis not present

## 2015-04-06 DIAGNOSIS — Z79899 Other long term (current) drug therapy: Secondary | ICD-10-CM | POA: Diagnosis not present

## 2015-04-06 DIAGNOSIS — E559 Vitamin D deficiency, unspecified: Secondary | ICD-10-CM | POA: Diagnosis not present

## 2015-04-06 LAB — CBC
HEMATOCRIT: 34.5 % — AB (ref 36.0–46.0)
HEMOGLOBIN: 11.3 g/dL — AB (ref 12.0–15.0)
MCH: 29.3 pg (ref 26.0–34.0)
MCHC: 32.8 g/dL (ref 30.0–36.0)
MCV: 89.4 fL (ref 78.0–100.0)
Platelets: 272 10*3/uL (ref 150–400)
RBC: 3.86 MIL/uL — ABNORMAL LOW (ref 3.87–5.11)
RDW: 13.5 % (ref 11.5–15.5)
WBC: 6.5 10*3/uL (ref 4.0–10.5)

## 2015-04-06 NOTE — Progress Notes (Signed)
NPO AFTER MN.  ARRIVE AT 0600.  LAB TO BE DONE TODAY.  WILL TAKE AM MEDS DOS  W/ SIPS OF WATER.

## 2015-04-06 NOTE — H&P (Signed)
  49 year old G3 P2 with menorrhagia. Recent SHG - 9 mm polyp  Past Medical History  Diagnosis Date  . History of abnormal cervical Pap smear   . Pituitary adenoma   . Hyperprolactinemia   . Menorrhagia    Past Surgical History  Procedure Laterality Date  . Umbilical hernia repair  05-18-2010   Prior to Admission medications   Medication Sig Start Date End Date Taking? Authorizing Provider  cabergoline (DOSTINEX) 0.5 MG tablet Take 0.5 tablets (0.25 mg total) by mouth 2 (two) times a week. Patient taking differently: Take 0.25 mg by mouth 2 (two) times a week.  08/26/14  Yes Renato Shin, MD  Cholecalciferol (VITAMIN D3) 5000 UNITS CAPS Take 1 capsule by mouth 3 (three) times a week.   Yes Historical Provider, MD  ISOtretinoin (ZENATANE) 30 MG capsule Take 30 mg by mouth 2 (two) times daily.   Yes Historical Provider, MD   Allergies:  Latex and Tetracyclines & related  Social History   Social History  . Marital Status: Married    Spouse Name: N/A  . Number of Children: N/A  . Years of Education: N/A   Occupational History  . Energy manager   Social History Main Topics  . Smoking status: Never Smoker   . Smokeless tobacco: Never Used  . Alcohol Use: Yes     Comment: occasional  . Drug Use: Not on file  . Sexual Activity: Not on file   Other Topics Concern  . 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 Hypertension Mother   . Hyperlipidemia Mother    Ht 5\' 3"  (1.6 m)  Wt 67.132 kg (148 lb)  BMI 26.22 kg/m2  LMP 03/22/2015 (Exact Date) Results for orders placed or performed during the hospital encounter of 04/11/15 (from the past 24 hour(s))  CBC     Status: Abnormal   Collection Time: 04/06/15  1:45 PM  Result Value Ref Range   WBC 6.5 4.0 - 10.5 K/uL   RBC 3.86 (L) 3.87 - 5.11 MIL/uL   Hemoglobin 11.3 (L) 12.0 - 15.0  g/dL   HCT 34.5 (L) 36.0 - 46.0 %   MCV 89.4 78.0 - 100.0 fL   MCH 29.3 26.0 - 34.0 pg   MCHC 32.8 30.0 - 36.0 g/dL   RDW 13.5 11.5 - 15.5 %   Platelets 272 150 - 400 K/uL   General alert and oriented Lung CTAB Car RRR Pelvic WNL  IMPRESSION: Menorrhagia Endometrial Polyp  PLAN: D and C,  HTA Risks reviewed Consent signed

## 2015-04-11 ENCOUNTER — Encounter (HOSPITAL_BASED_OUTPATIENT_CLINIC_OR_DEPARTMENT_OTHER): Payer: Self-pay | Admitting: *Deleted

## 2015-04-11 ENCOUNTER — Encounter (HOSPITAL_BASED_OUTPATIENT_CLINIC_OR_DEPARTMENT_OTHER)
Admission: RE | Disposition: A | Discharge status: Home / Self Care | Payer: Self-pay | Source: Ambulatory Visit | Attending: Obstetrics and Gynecology

## 2015-04-11 ENCOUNTER — Ambulatory Visit (HOSPITAL_BASED_OUTPATIENT_CLINIC_OR_DEPARTMENT_OTHER)
Admission: RE | Admit: 2015-04-11 | Discharge: 2015-04-11 | Disposition: A | Discharge status: Home / Self Care | Payer: No Typology Code available for payment source | Source: Ambulatory Visit | Attending: Obstetrics and Gynecology | Admitting: Obstetrics and Gynecology

## 2015-04-11 ENCOUNTER — Ambulatory Visit (HOSPITAL_BASED_OUTPATIENT_CLINIC_OR_DEPARTMENT_OTHER): Payer: No Typology Code available for payment source | Admitting: Anesthesiology

## 2015-04-11 DIAGNOSIS — E221 Hyperprolactinemia: Secondary | ICD-10-CM | POA: Insufficient documentation

## 2015-04-11 DIAGNOSIS — Z79899 Other long term (current) drug therapy: Secondary | ICD-10-CM | POA: Insufficient documentation

## 2015-04-11 DIAGNOSIS — Z91040 Latex allergy status: Secondary | ICD-10-CM | POA: Insufficient documentation

## 2015-04-11 DIAGNOSIS — N840 Polyp of corpus uteri: Secondary | ICD-10-CM | POA: Diagnosis not present

## 2015-04-11 DIAGNOSIS — N920 Excessive and frequent menstruation with regular cycle: Secondary | ICD-10-CM | POA: Insufficient documentation

## 2015-04-11 DIAGNOSIS — D352 Benign neoplasm of pituitary gland: Secondary | ICD-10-CM | POA: Insufficient documentation

## 2015-04-11 DIAGNOSIS — E559 Vitamin D deficiency, unspecified: Secondary | ICD-10-CM | POA: Insufficient documentation

## 2015-04-11 DIAGNOSIS — R51 Headache: Secondary | ICD-10-CM | POA: Insufficient documentation

## 2015-04-11 DIAGNOSIS — D759 Disease of blood and blood-forming organs, unspecified: Secondary | ICD-10-CM | POA: Insufficient documentation

## 2015-04-11 DIAGNOSIS — D649 Anemia, unspecified: Secondary | ICD-10-CM | POA: Insufficient documentation

## 2015-04-11 DIAGNOSIS — Z881 Allergy status to other antibiotic agents status: Secondary | ICD-10-CM | POA: Insufficient documentation

## 2015-04-11 HISTORY — DX: Hyperprolactinemia: E22.1

## 2015-04-11 HISTORY — DX: Benign neoplasm of pituitary gland: D35.2

## 2015-04-11 HISTORY — PX: DILITATION & CURRETTAGE/HYSTROSCOPY WITH HYDROTHERMAL ABLATION: SHX5570

## 2015-04-11 HISTORY — DX: Personal history of other diseases of the female genital tract: Z87.42

## 2015-04-11 HISTORY — DX: Excessive and frequent menstruation with regular cycle: N92.0

## 2015-04-11 LAB — POCT PREGNANCY, URINE: PREG TEST UR: NEGATIVE

## 2015-04-11 SURGERY — DILATATION & CURETTAGE/HYSTEROSCOPY WITH HYDROTHERMAL ABLATION
Anesthesia: General | Site: Uterus

## 2015-04-11 MED ORDER — MIDAZOLAM HCL 2 MG/2ML IJ SOLN
INTRAMUSCULAR | Status: AC
  Filled 2015-04-11: qty 2

## 2015-04-11 MED ORDER — ACETAMINOPHEN 10 MG/ML IV SOLN
INTRAVENOUS | Status: DC | PRN
  Administered 2015-04-11: 1000 mg via INTRAVENOUS

## 2015-04-11 MED ORDER — OXYCODONE HCL 5 MG/5ML PO SOLN
5.0000 mg | Freq: Once | ORAL | Status: DC | PRN
  Filled 2015-04-11: qty 5

## 2015-04-11 MED ORDER — OXYCODONE HCL 5 MG PO TABS
ORAL_TABLET | ORAL | Status: AC
  Filled 2015-04-11: qty 1

## 2015-04-11 MED ORDER — LIDOCAINE HCL (CARDIAC) 20 MG/ML IV SOLN
INTRAVENOUS | Status: DC | PRN
  Administered 2015-04-11: 50 mg via INTRAVENOUS

## 2015-04-11 MED ORDER — PROMETHAZINE HCL 25 MG/ML IJ SOLN
6.2500 mg | INTRAMUSCULAR | Status: DC | PRN
Start: 2015-04-11 — End: 2015-04-11
  Filled 2015-04-11: qty 1

## 2015-04-11 MED ORDER — LIDOCAINE HCL 1 % IJ SOLN
INTRAMUSCULAR | Status: DC | PRN
  Administered 2015-04-11: 10 mL

## 2015-04-11 MED ORDER — LACTATED RINGERS IV SOLN
INTRAVENOUS | Status: DC
  Administered 2015-04-11: 07:00:00 via INTRAVENOUS
  Filled 2015-04-11: qty 1000

## 2015-04-11 MED ORDER — FENTANYL CITRATE (PF) 100 MCG/2ML IJ SOLN
INTRAMUSCULAR | Status: AC
  Filled 2015-04-11: qty 2

## 2015-04-11 MED ORDER — OXYCODONE-ACETAMINOPHEN 10-325 MG PO TABS: 1.0000 | ORAL_TABLET | ORAL | Status: DC | PRN

## 2015-04-11 MED ORDER — IBUPROFEN 200 MG PO TABS: 600.0000 mg | ORAL_TABLET | Freq: Four times a day (QID) | ORAL | Status: DC | PRN

## 2015-04-11 MED ORDER — FENTANYL CITRATE (PF) 100 MCG/2ML IJ SOLN
25.0000 ug | INTRAMUSCULAR | Status: DC | PRN
  Administered 2015-04-11 (×2): 25 ug via INTRAVENOUS
  Filled 2015-04-11: qty 1

## 2015-04-11 MED ORDER — OXYCODONE HCL 5 MG PO TABS
5.0000 mg | ORAL_TABLET | Freq: Once | ORAL | Status: AC
  Administered 2015-04-11: 5 mg via ORAL
  Filled 2015-04-11: qty 1

## 2015-04-11 MED ORDER — OXYCODONE HCL 5 MG PO TABS
5.0000 mg | ORAL_TABLET | Freq: Once | ORAL | Status: DC | PRN
  Filled 2015-04-11: qty 1

## 2015-04-11 MED ORDER — FENTANYL CITRATE (PF) 100 MCG/2ML IJ SOLN
INTRAMUSCULAR | Status: AC
  Filled 2015-04-11: qty 4

## 2015-04-11 MED ORDER — PROPOFOL 10 MG/ML IV BOLUS
INTRAVENOUS | Status: DC | PRN
  Administered 2015-04-11: 20 mg via INTRAVENOUS
  Administered 2015-04-11: 180 mg via INTRAVENOUS

## 2015-04-11 MED ORDER — MIDAZOLAM HCL 5 MG/5ML IJ SOLN
INTRAMUSCULAR | Status: DC | PRN
  Administered 2015-04-11: 2 mg via INTRAVENOUS

## 2015-04-11 MED ORDER — CEFAZOLIN SODIUM-DEXTROSE 2-3 GM-% IV SOLR
INTRAVENOUS | Status: AC
  Filled 2015-04-11: qty 50

## 2015-04-11 MED ORDER — LACTATED RINGERS IV SOLN
INTRAVENOUS | Status: DC
Start: 2015-04-11 — End: 2015-04-11
  Administered 2015-04-11: 10:00:00 via INTRAVENOUS
  Filled 2015-04-11: qty 1000

## 2015-04-11 MED ORDER — FENTANYL CITRATE (PF) 100 MCG/2ML IJ SOLN
INTRAMUSCULAR | Status: DC | PRN
  Administered 2015-04-11 (×2): 25 ug via INTRAVENOUS

## 2015-04-11 MED ORDER — SODIUM CHLORIDE 0.9 % IR SOLN
Status: DC | PRN
  Administered 2015-04-11: 3000 mL

## 2015-04-11 MED ORDER — DEXAMETHASONE SODIUM PHOSPHATE 4 MG/ML IJ SOLN
INTRAMUSCULAR | Status: DC | PRN
  Administered 2015-04-11: 10 mg via INTRAVENOUS

## 2015-04-11 MED ORDER — CEFAZOLIN SODIUM-DEXTROSE 2-3 GM-% IV SOLR
2.0000 g | INTRAVENOUS | Status: AC
  Administered 2015-04-11: 2 g via INTRAVENOUS
  Filled 2015-04-11: qty 50

## 2015-04-11 MED ORDER — ONDANSETRON HCL 4 MG/2ML IJ SOLN
INTRAMUSCULAR | Status: DC | PRN
  Administered 2015-04-11: 4 mg via INTRAVENOUS

## 2015-04-11 MED ORDER — KETOROLAC TROMETHAMINE 30 MG/ML IJ SOLN
INTRAMUSCULAR | Status: DC | PRN
  Administered 2015-04-11: 30 mg via INTRAVENOUS

## 2015-04-11 SURGICAL SUPPLY — 26 items
CANISTER SUCTION 2500CC (MISCELLANEOUS) ×3 IMPLANT
CATH ROBINSON RED A/P 16FR (CATHETERS) ×3 IMPLANT
COVER BACK TABLE 60X90IN (DRAPES) ×3 IMPLANT
DRAPE HYSTEROSCOPY (DRAPE) ×3 IMPLANT
DRAPE LG THREE QUARTER DISP (DRAPES) ×3 IMPLANT
DRSG TELFA 3X8 NADH (GAUZE/BANDAGES/DRESSINGS) ×6 IMPLANT
ELECT REM PT RETURN 9FT ADLT (ELECTROSURGICAL)
ELECTRODE REM PT RTRN 9FT ADLT (ELECTROSURGICAL) IMPLANT
GLOVE SURG SS PI 6.5 STRL IVOR (GLOVE) ×2 IMPLANT
GLOVE SURG SS PI 7.5 STRL IVOR (GLOVE) ×3 IMPLANT
GOWN STRL REUS W/ TWL LRG LVL3 (GOWN DISPOSABLE) ×2 IMPLANT
GOWN STRL REUS W/TWL LRG LVL3 (GOWN DISPOSABLE) ×12 IMPLANT
LEGGING LITHOTOMY PAIR STRL (DRAPES) ×3 IMPLANT
LOOP ANGLED CUTTING 22FR (CUTTING LOOP) IMPLANT
MANIFOLD NEPTUNE II (INSTRUMENTS) IMPLANT
NDL SPNL 25GX3.5 QUINCKE BL (NEEDLE) ×1 IMPLANT
NEEDLE SPNL 25GX3.5 QUINCKE BL (NEEDLE) ×3 IMPLANT
PACK BASIN DAY SURGERY FS (CUSTOM PROCEDURE TRAY) ×3 IMPLANT
PAD DRESSING TELFA 3X8 NADH (GAUZE/BANDAGES/DRESSINGS) ×2 IMPLANT
SET GENESYS HTA PROCERVA (MISCELLANEOUS) ×2 IMPLANT
SYR CONTROL 10ML LL (SYRINGE) ×3 IMPLANT
TOWEL OR 17X24 6PK STRL BLUE (TOWEL DISPOSABLE) ×6 IMPLANT
TRAY DSU PREP LF (CUSTOM PROCEDURE TRAY) ×3 IMPLANT
TUBING AQUILEX INFLOW (TUBING) IMPLANT
TUBING AQUILEX OUTFLOW (TUBING) IMPLANT
WATER STERILE IRR 500ML POUR (IV SOLUTION) ×3 IMPLANT

## 2015-04-11 NOTE — Brief Op Note (Signed)
04/11/2015  8:18 AM  PATIENT:  Tina Martin  49 y.o. female  PRE-OPERATIVE DIAGNOSIS:  menorrhagia  POST-OPERATIVE DIAGNOSIS:  menorrhagia  PROCEDURE:  Procedure(s): DILATATION & CURETTAGE/HYSTEROSCOPY WITH HYDROTHERMAL ABLATION (N/A)  SURGEON:  Surgeon(s) and Role:    * Dian Queen, MD - Primary  PHYSICIAN ASSISTANT:   ASSISTANTS: none   ANESTHESIA:   paracervical block and MAC  EBL:  Total I/O In: 100 [I.V.:100] Out: -   BLOOD ADMINISTERED:none  DRAINS: none   LOCAL MEDICATIONS USED:  XYLOCAINE   SPECIMEN:  Source of Specimen:  uterine currettings  DISPOSITION OF SPECIMEN:  PATHOLOGY  COUNTS:  YES  TOURNIQUET:  * No tourniquets in log *  DICTATION: .Other Dictation: Dictation Number [id number]  PLAN OF CARE: Discharge to home after PACU  PATIENT DISPOSITION:  PACU - hemodynamically stable.   Delay start of Pharmacological VTE agent (>24hrs) due to surgical blood loss or risk of bleeding: not applicable

## 2015-04-11 NOTE — Discharge Instructions (Signed)
° °  D & C Home care Instructions:   Personal hygiene:  Used sanitary napkins for vaginal drainage not tampons. Shower or tub bathe the day after your procedure. No douching until bleeding stops. Always wipe from front to back after  Elimination.  Activity: Do not drive or operate any equipment today. The effects of the anesthesia are still present and drowsiness may result. Rest today, not necessarily flat bed rest, just take it easy. You may resume your normal activity in one to 2 days.  Sexual activity: No intercourse for one week or as indicated by your physician  Diet: Eat a light diet as desired this evening. You may resume a regular diet tomorrow.  Return to work: One to 2 days.  General Expectations of your surgery: Vaginal bleeding should be no heavier than a normal period. Spotting may continue up to 10 days. Mild cramps may continue for a couple of days. You may have a regular period in 2-6 weeks.  Unexpected observations call your doctor if these occur: persistent or heavy bleeding. Severe abdominal cramping or pain. Elevation of temperature greater than 100F.       Post Anesthesia Home Care Instructions  Activity: Get plenty of rest for the remainder of the day. A responsible adult should stay with you for 24 hours following the procedure.  For the next 24 hours, DO NOT: -Drive a car -Paediatric nurse -Drink alcoholic beverages -Take any medication unless instructed by your physician -Make any legal decisions or sign important papers.  Meals: Start with liquid foods such as gelatin or soup. Progress to regular foods as tolerated. Avoid greasy, spicy, heavy foods. If nausea and/or vomiting occur, drink only clear liquids until the nausea and/or vomiting subsides. Call your physician if vomiting continues.  Special Instructions/Symptoms: Your throat may feel dry or sore from the anesthesia or the breathing tube placed in your throat during surgery. If this causes  discomfort, gargle with warm salt water. The discomfort should disappear within 24 hours.  If you had a scopolamine patch placed behind your ear for the management of post- operative nausea and/or vomiting:  1. The medication in the patch is effective for 72 hours, after which it should be removed.  Wrap patch in a tissue and discard in the trash. Wash hands thoroughly with soap and water. 2. You may remove the patch earlier than 72 hours if you experience unpleasant side effects which may include dry mouth, dizziness or visual disturbances. 3. Avoid touching the patch. Wash your hands with soap and water after contact with the patch.

## 2015-04-11 NOTE — Anesthesia Preprocedure Evaluation (Addendum)
Anesthesia Evaluation  Patient identified by MRN, date of birth, ID band Patient awake    Reviewed: Allergy & Precautions, NPO status , Patient's Chart, lab work & pertinent test results  History of Anesthesia Complications Negative for: history of anesthetic complications  Airway Mallampati: I  TM Distance: >3 FB Neck ROM: Full    Dental  (+) Teeth Intact, Dental Advisory Given   Pulmonary neg pulmonary ROS,  breath sounds clear to auscultation  Pulmonary exam normal       Cardiovascular Exercise Tolerance: Good - anginanegative cardio ROS Normal cardiovascular examRhythm:Regular Rate:Normal     Neuro/Psych  Headaches, Pituitary adenoma, prolactinoma negative psych ROS   GI/Hepatic negative GI ROS, Neg liver ROS,   Endo/Other  negative endocrine ROS  Renal/GU negative Renal ROS  negative genitourinary   Musculoskeletal negative musculoskeletal ROS (+)   Abdominal   Peds  Hematology  (+) Blood dyscrasia, anemia ,   Anesthesia Other Findings Day of surgery medications reviewed with the patient.  Reproductive/Obstetrics                            Anesthesia Physical Anesthesia Plan  ASA: II  Anesthesia Plan: General   Post-op Pain Management:    Induction: Intravenous  Airway Management Planned: LMA  Additional Equipment:   Intra-op Plan:   Post-operative Plan: Extubation in OR  Informed Consent: I have reviewed the patients History and Physical, chart, labs and discussed the procedure including the risks, benefits and alternatives for the proposed anesthesia with the patient or authorized representative who has indicated his/her understanding and acceptance.   Dental advisory given  Plan Discussed with: CRNA  Anesthesia Plan Comments: (Risks/benefits of general anesthesia discussed with patient including risk of damage to teeth, lips, gum, and tongue, nausea/vomiting,  allergic reactions to medications, and the possibility of heart attack, stroke and death.  All patient questions answered.  Patient wishes to proceed.)       Anesthesia Quick Evaluation

## 2015-04-11 NOTE — Progress Notes (Signed)
H and P on the chart No significant changes Will proceed with D and C, [place], HTA Consent signed

## 2015-04-11 NOTE — Transfer of Care (Signed)
Immediate Anesthesia Transfer of Care Note  Patient: Tina Martin  Procedure(s) Performed: Procedure(s): DILATATION & CURETTAGE/HYSTEROSCOPY WITH HYDROTHERMAL ABLATION (N/A)  Patient Location: PACU  Anesthesia Type:General  Level of Consciousness: awake, alert  and oriented  Airway & Oxygen Therapy: Patient Spontanous Breathing and Patient connected to nasal cannula oxygen  Post-op Assessment: Report given to RN  Post vital signs: Reviewed and stable  Last Vitals:  Filed Vitals:   04/11/15 0607  BP: 102/63  Pulse: 59  Temp: 36.9 C  Resp: 16    Complications: No apparent anesthesia complications

## 2015-04-11 NOTE — Op Note (Signed)
NAMEPETRINA, Tina Martin                ACCOUNT NO.:  [id number]  MEDICAL RECORD NO.:  [id number]  LOCATION:                               FACILITY:  [facility]  PHYSICIAN:  Kerri Kovacik L. Redell Bhandari, M.D.DATE OF BIRTH:  1966/06/14  DATE OF PROCEDURE:  04/11/2015 DATE OF DISCHARGE:                              OPERATIVE REPORT   PREOPERATIVE DIAGNOSES:  Menorrhagia and endometrial polyp.  POSTOPERATIVE DIAGNOSES:  Menorrhagia and endometrial polyp.  PROCEDURE:  D and C, hysteroscopy, and HTA.  SURGEON:  Katlyne Nishida L. Helane Rima, M.D.  ANESTHESIA:  LMA with paracervical block.  EBL:  Minimal.  COMPLICATIONS:  None.  PATHOLOGY:  Uterine curettings.  DESCRIPTION OF PROCEDURE:  The patient was taken to the operating room. She was administered anesthesia.  She was prepped and draped in usual sterile fashion.  In and out catheter was used to empty the bladder.  A speculum was inserted into the vagina.  The cervix was grasped with a tenaculum and a paracervical block was performed in standard fashion. The cervical internal os was gently dilated using Pratt dilators.  Sharp curette was inserted and the uterus was thoroughly curetted of all tissue.  Tissue was sent to Pathology.  The hysteroscope was inserted with excellent visualization.  We could see the cavity appeared normal. Tubal ostia were seen.  The uterus was retroverted.  Hysteroscopy was continued and then an HTA was performed with 0 fluid leak, and we were able to do HTA for an interrupted 10-minute cycle according to the manufacturer specifications.  At the end of the procedure, all instruments were removed from the vagina.  No vaginal bleeding was noted.  The patient tolerated the procedure well, went to the recovery room in a stable condition.     Shatonya Passon L. Helane Rima, M.D.     [name]  D:  04/11/2015  T:  04/11/2015  Job:  [id number]

## 2015-04-11 NOTE — Anesthesia Procedure Notes (Signed)
Procedure Name: LMA Insertion Date/Time: 04/11/2015 7:36 AM Performed by: Bethena Roys T Pre-anesthesia Checklist: Patient identified, Emergency Drugs available, Suction available and Patient being monitored Patient Re-evaluated:Patient Re-evaluated prior to inductionOxygen Delivery Method: Circle System Utilized Preoxygenation: Pre-oxygenation with 100% oxygen Intubation Type: IV induction Ventilation: Mask ventilation without difficulty LMA: LMA inserted LMA Size: 4.0 Number of attempts: 1 Airway Equipment and Method: Bite block Placement Confirmation: positive ETCO2 Tube secured with: Tape Dental Injury: Teeth and Oropharynx as per pre-operative assessment

## 2015-04-11 NOTE — Anesthesia Postprocedure Evaluation (Signed)
  Anesthesia Post-op Note  Patient: Tina Martin  Procedure(s) Performed: Procedure(s) (LRB): DILATATION & CURETTAGE/HYSTEROSCOPY WITH HYDROTHERMAL ABLATION (N/A)  Patient Location: PACU  Anesthesia Type: General  Level of Consciousness: awake and alert   Airway and Oxygen Therapy: Patient Spontanous Breathing  Post-op Pain: mild  Post-op Assessment: Post-op Vital signs reviewed, Patient's Cardiovascular Status Stable, Respiratory Function Stable, Patent Airway and No signs of Nausea or vomiting  Last Vitals:  Filed Vitals:   04/11/15 1045  BP: 133/63  Pulse: 60  Temp: 36.7 C  Resp: 16    Post-op Vital Signs: stable   Complications: No apparent anesthesia complications

## 2015-04-12 ENCOUNTER — Encounter (HOSPITAL_BASED_OUTPATIENT_CLINIC_OR_DEPARTMENT_OTHER): Payer: Self-pay | Admitting: Obstetrics and Gynecology

## 2015-07-20 ENCOUNTER — Other Ambulatory Visit (INDEPENDENT_AMBULATORY_CARE_PROVIDER_SITE_OTHER): Payer: No Typology Code available for payment source

## 2015-07-20 DIAGNOSIS — E559 Vitamin D deficiency, unspecified: Secondary | ICD-10-CM | POA: Diagnosis not present

## 2015-07-20 DIAGNOSIS — E221 Hyperprolactinemia: Secondary | ICD-10-CM

## 2015-07-20 LAB — VITAMIN D 25 HYDROXY (VIT D DEFICIENCY, FRACTURES): VITD: 21.44 ng/mL — AB (ref 30.00–100.00)

## 2015-07-21 LAB — PROLACTIN: PROLACTIN: 48.8 ng/mL

## 2015-07-22 ENCOUNTER — Telehealth: Payer: Self-pay | Admitting: Endocrinology

## 2015-07-22 NOTE — Telephone Encounter (Signed)
Patient calling for the result for labs

## 2015-07-22 NOTE — Telephone Encounter (Signed)
I contacted the pt and advise of results based on of the MyChart message from 12.1.2016 via voicemail.

## 2015-07-28 ENCOUNTER — Other Ambulatory Visit: Payer: Self-pay | Admitting: Endocrinology

## 2015-08-26 ENCOUNTER — Ambulatory Visit: Payer: PRIVATE HEALTH INSURANCE | Admitting: Endocrinology

## 2015-09-16 ENCOUNTER — Encounter: Payer: Self-pay | Admitting: Internal Medicine

## 2015-11-07 ENCOUNTER — Ambulatory Visit: Payer: No Typology Code available for payment source | Admitting: Internal Medicine

## 2015-11-23 ENCOUNTER — Ambulatory Visit (INDEPENDENT_AMBULATORY_CARE_PROVIDER_SITE_OTHER): Payer: No Typology Code available for payment source | Admitting: Endocrinology

## 2015-11-23 ENCOUNTER — Encounter: Payer: Self-pay | Admitting: Endocrinology

## 2015-11-23 VITALS — BP 122/70 | HR 66 | Temp 98.7°F | Ht 63.0 in | Wt 142.0 lb

## 2015-11-23 DIAGNOSIS — E221 Hyperprolactinemia: Secondary | ICD-10-CM | POA: Diagnosis not present

## 2015-11-23 DIAGNOSIS — D353 Benign neoplasm of craniopharyngeal duct: Secondary | ICD-10-CM

## 2015-11-23 DIAGNOSIS — E559 Vitamin D deficiency, unspecified: Secondary | ICD-10-CM | POA: Diagnosis not present

## 2015-11-23 DIAGNOSIS — D352 Benign neoplasm of pituitary gland: Secondary | ICD-10-CM

## 2015-11-23 LAB — BASIC METABOLIC PANEL
BUN: 16 mg/dL (ref 6–23)
CO2: 28 mEq/L (ref 19–32)
CREATININE: 0.85 mg/dL (ref 0.40–1.20)
Calcium: 9.6 mg/dL (ref 8.4–10.5)
Chloride: 100 mEq/L (ref 96–112)
GFR: 75.74 mL/min (ref >60.00)
Glucose, Bld: 90 mg/dL (ref 70–99)
Potassium: 3.8 mEq/L (ref 3.5–5.1)
Sodium: 136 mEq/L (ref 135–145)

## 2015-11-23 LAB — VITAMIN D 25 HYDROXY (VIT D DEFICIENCY, FRACTURES): VITD: 38.35 ng/mL (ref 30.00–100.00)

## 2015-11-23 LAB — TSH: TSH: 2.17 u[IU]/mL (ref 0.35–4.50)

## 2015-11-23 LAB — T4, FREE: Free T4: 0.75 ng/dL (ref 0.60–1.60)

## 2015-11-23 LAB — PROLACTIN: PROLACTIN: 57.2 ng/mL — AB

## 2015-11-23 NOTE — Patient Instructions (Signed)
blood tests are requested for you today.  We'll let you know about the results. Let's recheck the MRI.  you will receive a phone call, about a day and time for an appointment.

## 2015-11-23 NOTE — Progress Notes (Signed)
Subjective:    Patient ID: Tina Martin, female    DOB: 1965/10/17, 50 y.o.   MRN: [id number]  HPI Pt returns for f/u of pituitary microadenoma and hyperprolactinemia (dx'ed [id number]; uncertain relationship between the two; her husband has had a vasectomy; last MRI was in 2015, which showed only minimal tumor growth).  She takes cabergoline as rx'ed).  She has few weeks of slight pain at the roof of the mouth, and assoc blurry vision.  She saw dentist, who said teeth were fine.  She has apt to see opthal tomorrow.  She seldom has menses, due to uterine ablation.   Past Medical History  Diagnosis Date  . History of abnormal cervical Pap smear   . Pituitary adenoma ([place])   . Hyperprolactinemia ([place])   . Menorrhagia   . Nephrolithiasis   . Diverticulitis     Past Surgical History  Procedure Laterality Date  . Umbilical hernia repair  05-18-2010  . Dilitation & currettage/hystroscopy with hydrothermal ablation N/A 04/11/2015    Procedure: DILATATION & CURETTAGE/HYSTEROSCOPY WITH HYDROTHERMAL ABLATION;  Surgeon: Dian Queen, MD;  Location: [facility];  Service: Gynecology;  Laterality: N/A;    Social History   Social History  . Marital Status: Married    Spouse Name: N/A  . Number of Children: N/A  . Years of Education: N/A   Occupational History  . Energy manager   Social History Main Topics  . Smoking status: Never Smoker   . Smokeless tobacco: Never Used  . Alcohol Use: Yes     Comment: occasional  . Drug Use: Not on file  . Sexual Activity: Not on file   Other Topics Concern  . 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 cabergoline (DOSTINEX) 0.5 MG tablet TAKE 1/2 TABLET TWICE A WEEK 10 tablet 5   No current facility-administered medications on file prior to visit.     Allergies  Allergen Reactions  . Chlorhexidine Gluconate Rash  . Latex Rash  . Tetracyclines & Related Rash    Severe     Family History  Problem Relation Age of Onset  . Hypertension Mother   . Hyperlipidemia Mother     BP 122/70 mmHg  Pulse 66  Temp(Src) 98.7 F (37.1 C) (Oral)  Ht 5\' 3"  (1.6 m)  Wt 142 lb (64.411 kg)  BMI 25.16 kg/m2  SpO2 97%  Review of Systems Denies headache and galactorrhea.     Objective:   Physical Exam VITAL SIGNS:  See vs page GENERAL: no distress head: no deformity eyes: no periorbital swelling, no proptosis.   external nose and ears are normal mouth: no lesion seen Both eac's and tm's are normal NECK: There is no palpable thyroid enlargement.  No thyroid nodule is palpable.  No palpable lymphadenopathy at the anterior neck.   Prolactin=57    Assessment & Plan:  Headache, new.  Uncertain if pituitary-related.  Pituitary tumor, due for recheck Hyperprolactinemia: only slightly higher now.    Patient is advised the following: Patient Instructions  blood tests are requested for you today.  We'll let you know about the results. Let's recheck the MRI.  you will receive a phone call, about a day and time for an appointment.   addendum: Please continue the  same cabergoline.

## 2015-11-24 ENCOUNTER — Encounter: Payer: Self-pay | Admitting: *Deleted

## 2015-12-05 ENCOUNTER — Encounter: Payer: Self-pay | Admitting: Internal Medicine

## 2015-12-05 ENCOUNTER — Ambulatory Visit (INDEPENDENT_AMBULATORY_CARE_PROVIDER_SITE_OTHER): Payer: No Typology Code available for payment source | Admitting: Internal Medicine

## 2015-12-05 VITALS — BP 108/80 | HR 73 | Ht 63.0 in | Wt 142.0 lb

## 2015-12-05 DIAGNOSIS — Z1211 Encounter for screening for malignant neoplasm of colon: Secondary | ICD-10-CM

## 2015-12-05 DIAGNOSIS — Z8719 Personal history of other diseases of the digestive system: Secondary | ICD-10-CM

## 2015-12-05 MED ORDER — NA SULFATE-K SULFATE-MG SULF 17.5-3.13-1.6 GM/177ML PO SOLN: ORAL | Status: DC

## 2015-12-05 NOTE — Patient Instructions (Signed)

## 2015-12-05 NOTE — Progress Notes (Signed)
Patient ID: Tina Martin, female   DOB: August 10, 1966, 50 y.o.   MRN: [id number] HPI: Tina Martin is a 50 year old female with a history of sigmoid diverticulitis, pituitary adenoma causing hyperprolactinemia who is seen in consultation at the request of Dr. Tollie Pizza given her history of diverticulitis. She is here alone today. She reports that in January 2017 she developed periumbilical, suprapubic and right lower quadrant abdominal pain. This started somewhat slowly but then quickly progressed over 12 hours to become intense pain. Low-grade fever. She was seen in an emergency department and a CT scan was performed which revealed sigmoid diverticulitis without complication. Labs were unremarkable except for a mild left shift. White count was 9.7. She was treated with Cipro and Flagyl for 10 days and within 2-3 days her pain resolved completely. It has not recurred. She did have poor tolerance of the Flagyl but no true allergy. She denies issues with constipation or diarrhea. No other abdominal pain. Good appetite. Stable weight. No hepatobiliary complaint. No issues with swallowing or frequent heartburn. Her family history is unremarkable except her father has had diverticulosis and one episode of diverticulitis. No family history of colorectal cancer. She sees Dr. Loanne Drilling for her prolactinoma and takes carbergoline twice per week.  Past Medical History  Diagnosis Date  . History of abnormal cervical Pap smear   . Pituitary adenoma ([place])   . Hyperprolactinemia ([place])   . Menorrhagia   . Nephrolithiasis   . Diverticulitis     Past Surgical History  Procedure Laterality Date  . Umbilical hernia repair  05-18-2010  . Dilitation & currettage/hystroscopy with hydrothermal ablation N/A 04/11/2015    Procedure: DILATATION & CURETTAGE/HYSTEROSCOPY WITH HYDROTHERMAL ABLATION;  Surgeon: Dian Queen, MD;  Location: [facility];  Service: Gynecology;  Laterality: N/A;    Outpatient  Prescriptions Prior to Visit  Medication Sig Dispense Refill  . cabergoline (DOSTINEX) 0.5 MG tablet TAKE 1/2 TABLET TWICE A WEEK 10 tablet 5   No facility-administered medications prior to visit.    Allergies  Allergen Reactions  . Chlorhexidine Gluconate Rash  . Latex Rash  . Tetracyclines & Related Rash    Severe     Family History  Problem Relation Age of Onset  . Hypertension Mother   . Hyperlipidemia Mother     Social History  Substance Use Topics  . Smoking status: Never Smoker   . Smokeless tobacco: Never Used  . Alcohol Use: 0.0 oz/week    0 Standard drinks or equivalent per week     Comment: occasional    ROS: As per history of present illness, otherwise negative  BP 108/80 mmHg  Pulse 73  Ht 5\' 3"  (1.6 m)  Wt 142 lb (64.411 kg)  BMI 25.16 kg/m2 Constitutional: Well-developed and well-nourished. No distress. HEENT: Normocephalic and atraumatic. Oropharynx is clear and moist. No oropharyngeal exudate. Conjunctivae are normal.  No scleral icterus. Neck: Neck supple. Trachea midline. Cardiovascular: Normal rate, regular rhythm and intact distal pulses. No M/R/G Pulmonary/chest: Effort normal and breath sounds normal. No wheezing, rales or rhonchi. Abdominal: Soft, nontender, nondistended. Bowel sounds active throughout. There are no masses palpable. No hepatosplenomegaly. Extremities: no clubbing, cyanosis, or edema Lymphadenopathy: No cervical adenopathy noted. Neurological: Alert and oriented to person place and time. Skin: Skin is warm and dry. No rashes noted. Psychiatric: Normal mood and affect. Behavior is normal.  RELEVANT LABS AND IMAGING: Reviewed through care everywhere from ER visit in January  ASSESSMENT/PLAN: 50 year old female with a history of sigmoid diverticulitis, pituitary  adenoma causing hyperprolactinemia who is seen in consultation at the request of Dr. Tollie Pizza given her history of diverticulitis.  1. Colon cancer screening/hx of  diverticulitis -- she had uncomplicated diverticulitis which has resolved without ongoing symptoms. I recommended we proceed with colorectal cancer screening at this time. We discussed the risk benefits and alternatives and she is agreeable to proceed.  2. 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 No hyperprolactinemia GI symptoms currently.    [id number] A Burnett, [address] [place], [place] [address]

## 2015-12-12 ENCOUNTER — Ambulatory Visit
Admission: RE | Admit: 2015-12-12 | Discharge: 2015-12-12 | Disposition: A | Discharge status: Home / Self Care | Payer: No Typology Code available for payment source | Source: Ambulatory Visit | Attending: Endocrinology | Admitting: Endocrinology

## 2015-12-12 DIAGNOSIS — D353 Benign neoplasm of craniopharyngeal duct: Principal | ICD-10-CM

## 2015-12-12 DIAGNOSIS — D352 Benign neoplasm of pituitary gland: Secondary | ICD-10-CM

## 2015-12-27 DIAGNOSIS — Z23 Encounter for immunization: Secondary | ICD-10-CM | POA: Insufficient documentation

## 2016-01-17 ENCOUNTER — Encounter: Payer: No Typology Code available for payment source | Admitting: Internal Medicine

## 2016-02-07 ENCOUNTER — Encounter: Payer: Self-pay | Admitting: Internal Medicine

## 2016-02-14 ENCOUNTER — Telehealth: Payer: Self-pay | Admitting: Internal Medicine

## 2016-02-14 NOTE — Telephone Encounter (Signed)
No, she should be advised to reschedule

## 2016-02-15 NOTE — Telephone Encounter (Signed)
Patient rescheduled for 04-26-16/yf

## 2016-02-16 ENCOUNTER — Encounter: Payer: No Typology Code available for payment source | Admitting: Internal Medicine

## 2016-03-29 DIAGNOSIS — M545 Low back pain: Secondary | ICD-10-CM | POA: Diagnosis not present

## 2016-03-29 DIAGNOSIS — M546 Pain in thoracic spine: Secondary | ICD-10-CM | POA: Diagnosis not present

## 2016-04-25 ENCOUNTER — Telehealth: Payer: Self-pay | Admitting: Internal Medicine

## 2016-04-25 NOTE — Telephone Encounter (Signed)
Noted. Dr. Hilarie Fredrickson aware.

## 2016-04-26 ENCOUNTER — Encounter: Payer: No Typology Code available for payment source | Admitting: Internal Medicine

## 2016-06-15 DIAGNOSIS — R5383 Other fatigue: Secondary | ICD-10-CM | POA: Diagnosis not present

## 2016-06-15 DIAGNOSIS — E039 Hypothyroidism, unspecified: Secondary | ICD-10-CM | POA: Diagnosis not present

## 2016-06-15 DIAGNOSIS — E8840 Mitochondrial metabolism disorder, unspecified: Secondary | ICD-10-CM | POA: Diagnosis not present

## 2016-06-15 DIAGNOSIS — E7800 Pure hypercholesterolemia, unspecified: Secondary | ICD-10-CM | POA: Diagnosis not present

## 2016-06-15 DIAGNOSIS — E7210 Disorders of sulfur-bearing amino-acid metabolism, unspecified: Secondary | ICD-10-CM | POA: Diagnosis not present

## 2016-06-15 DIAGNOSIS — R799 Abnormal finding of blood chemistry, unspecified: Secondary | ICD-10-CM | POA: Diagnosis not present

## 2016-06-15 DIAGNOSIS — E509 Vitamin A deficiency, unspecified: Secondary | ICD-10-CM | POA: Diagnosis not present

## 2016-06-19 DIAGNOSIS — D352 Benign neoplasm of pituitary gland: Secondary | ICD-10-CM | POA: Diagnosis not present

## 2016-07-02 ENCOUNTER — Ambulatory Visit
Admission: RE | Admit: 2016-07-02 | Discharge: 2016-07-02 | Disposition: A | Discharge status: Home / Self Care | Payer: BLUE CROSS/BLUE SHIELD | Source: Ambulatory Visit | Attending: Endocrinology | Admitting: Endocrinology

## 2016-07-02 DIAGNOSIS — D352 Benign neoplasm of pituitary gland: Secondary | ICD-10-CM | POA: Diagnosis not present

## 2016-07-02 MED ORDER — GADOBENATE DIMEGLUMINE 529 MG/ML IV SOLN
10.0000 mL | Freq: Once | INTRAVENOUS | Status: AC | PRN
  Administered 2016-07-02: 10 mL via INTRAVENOUS

## 2016-07-08 ENCOUNTER — Other Ambulatory Visit: Payer: Self-pay | Admitting: Endocrinology

## 2016-07-16 DIAGNOSIS — N943 Premenstrual tension syndrome: Secondary | ICD-10-CM | POA: Diagnosis not present

## 2016-08-23 DIAGNOSIS — Z1231 Encounter for screening mammogram for malignant neoplasm of breast: Secondary | ICD-10-CM | POA: Diagnosis not present

## 2016-08-23 DIAGNOSIS — Z01419 Encounter for gynecological examination (general) (routine) without abnormal findings: Secondary | ICD-10-CM | POA: Diagnosis not present

## 2016-08-23 DIAGNOSIS — Z6827 Body mass index (BMI) 27.0-27.9, adult: Secondary | ICD-10-CM | POA: Diagnosis not present

## 2016-10-22 DIAGNOSIS — M25511 Pain in right shoulder: Secondary | ICD-10-CM | POA: Diagnosis not present

## 2016-10-29 DIAGNOSIS — M25511 Pain in right shoulder: Secondary | ICD-10-CM | POA: Diagnosis not present

## 2017-02-04 DIAGNOSIS — H00015 Hordeolum externum left lower eyelid: Secondary | ICD-10-CM | POA: Diagnosis not present

## 2017-02-06 DIAGNOSIS — H00025 Hordeolum internum left lower eyelid: Secondary | ICD-10-CM | POA: Diagnosis not present

## 2017-05-07 DIAGNOSIS — L570 Actinic keratosis: Secondary | ICD-10-CM | POA: Diagnosis not present

## 2017-05-22 DIAGNOSIS — M25511 Pain in right shoulder: Secondary | ICD-10-CM | POA: Diagnosis not present

## 2017-07-28 ENCOUNTER — Other Ambulatory Visit: Payer: Self-pay | Admitting: Endocrinology

## 2017-07-28 NOTE — Telephone Encounter (Signed)
Please refill x 2 mos Ov is due 

## 2017-07-31 ENCOUNTER — Other Ambulatory Visit: Payer: Self-pay

## 2017-07-31 MED ORDER — CABERGOLINE 0.5 MG PO TABS
ORAL_TABLET | ORAL | 0 refills | Status: DC
Start: 2017-07-31 — End: 2017-09-06

## 2017-09-05 ENCOUNTER — Ambulatory Visit: Payer: BLUE CROSS/BLUE SHIELD | Admitting: Endocrinology

## 2017-09-05 ENCOUNTER — Encounter: Payer: Self-pay | Admitting: Endocrinology

## 2017-09-05 VITALS — BP 108/76 | HR 72 | Wt 154.0 lb

## 2017-09-05 DIAGNOSIS — E221 Hyperprolactinemia: Secondary | ICD-10-CM | POA: Diagnosis not present

## 2017-09-05 LAB — TSH: TSH: 1.63 u[IU]/mL (ref 0.35–4.50)

## 2017-09-05 LAB — T4, FREE: FREE T4: 0.98 ng/dL (ref 0.60–1.60)

## 2017-09-05 NOTE — Progress Notes (Signed)
   Subjective:    Patient ID: Tina Martin, female    DOB: 10-31-1965, 52 y.o.   MRN: [id number]  HPI Pt returns for f/u of pituitary microadenoma and hyperprolactinemia (dx'ed 0350; uncertain relationship between the two; her husband has had a vasectomy; last MRI was in 2015, which showed only minimal tumor growth).  She takes cabergoline as rx'ed).  She takes cabergoline as rx'ed.  She has reg menses.   Past Medical History:  Diagnosis Date  . Diverticulitis   . History of abnormal cervical Pap smear   . Hyperprolactinemia ([place])   . Menorrhagia   . Nephrolithiasis   . Pituitary adenoma [facility])     Past Surgical History:  Procedure Laterality Date  . DILITATION & CURRETTAGE/HYSTROSCOPY WITH HYDROTHERMAL ABLATION N/A 04/11/2015   Procedure: DILATATION & CURETTAGE/HYSTEROSCOPY WITH HYDROTHERMAL ABLATION;  Surgeon: Dian Queen, MD;  Location: [facility];  Service: Gynecology;  Laterality: N/A;  . UMBILICAL HERNIA REPAIR  05-18-2010    Social History   Socioeconomic History  . Marital status: Married    Spouse name: Not on file  . Number of children: Not on file  . Years of education: Not on file  . Highest education level: Not on file  Social Needs  . Financial resource strain: Not on file  . Food insecurity - worry: Not on file  . Food insecurity - inability: Not on file  . Transportation needs - medical: Not on file  . Transportation needs - non-medical: Not on file  Occupational History  . Occupation: Owner    CommentVisual merchandiser  Tobacco Use  . Smoking status: Never Smoker  . Smokeless tobacco: Never Used  Substance and Sexual Activity  . Alcohol use: Yes    Alcohol/week: 0.0 oz    Comment: occasional  . Drug use: Not on file  . Sexual activity: Not on file  Other Topics Concern  . 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   No current facility-administered medications on file prior to visit.     Allergies  Allergen Reactions  . Chlorhexidine Gluconate Rash  . Latex Rash  . Tetracyclines & Related Rash    Severe     Family History  Problem Relation Age of Onset  . Hypertension Mother   . Hyperlipidemia Mother     BP 108/76 (BP Location: Left Arm, Patient Position: Sitting, Cuff Size: Normal)   Pulse 72   Wt 154 lb (69.9 kg)   SpO2 95%   BMI 27.28 kg/m    Review of Systems No nausea    Objective:   Physical Exam VITAL SIGNS:  See vs page GENERAL: no distress NECK: There is no palpable thyroid enlargement.  No thyroid nodule is palpable.  No palpable lymphadenopathy at the anterior neck.      Assessment & Plan:  Hyperprolactinemia: due for recheck.   Pituitary microadenoma, stable over time.  We discussed.   Patient Instructions  We can hold off on the MRI for now.  blood tests are requested for you today.  We'll let you know about the results. We'll increase the cabergoline if necessary.  Please come back for a follow-up appointment in 1 year.

## 2017-09-05 NOTE — Patient Instructions (Signed)
We can hold off on the MRI for now.  blood tests are requested for you today.  We'll let you know about the results. We'll increase the cabergoline if necessary.  Please come back for a follow-up appointment in 1 year.

## 2017-09-06 LAB — PROLACTIN: PROLACTIN: 61 ng/mL — AB

## 2017-09-06 MED ORDER — CABERGOLINE 0.5 MG PO TABS: 0.5000 mg | ORAL_TABLET | ORAL | 3 refills | Status: DC

## 2017-09-10 ENCOUNTER — Other Ambulatory Visit: Payer: Self-pay | Admitting: Endocrinology

## 2017-09-10 MED ORDER — CABERGOLINE 0.5 MG PO TABS: 0.5000 mg | ORAL_TABLET | ORAL | 3 refills | Status: DC

## 2017-09-24 DIAGNOSIS — Z1212 Encounter for screening for malignant neoplasm of rectum: Secondary | ICD-10-CM | POA: Diagnosis not present

## 2017-09-24 DIAGNOSIS — Z1231 Encounter for screening mammogram for malignant neoplasm of breast: Secondary | ICD-10-CM | POA: Diagnosis not present

## 2017-09-24 DIAGNOSIS — Z6827 Body mass index (BMI) 27.0-27.9, adult: Secondary | ICD-10-CM | POA: Diagnosis not present

## 2017-09-24 DIAGNOSIS — Z01419 Encounter for gynecological examination (general) (routine) without abnormal findings: Secondary | ICD-10-CM | POA: Diagnosis not present

## 2017-10-06 ENCOUNTER — Other Ambulatory Visit: Payer: Self-pay | Admitting: Endocrinology

## 2018-03-25 DIAGNOSIS — M9902 Segmental and somatic dysfunction of thoracic region: Secondary | ICD-10-CM | POA: Diagnosis not present

## 2018-03-25 DIAGNOSIS — M9905 Segmental and somatic dysfunction of pelvic region: Secondary | ICD-10-CM | POA: Diagnosis not present

## 2018-03-25 DIAGNOSIS — M9903 Segmental and somatic dysfunction of lumbar region: Secondary | ICD-10-CM | POA: Diagnosis not present

## 2018-03-25 DIAGNOSIS — M62830 Muscle spasm of back: Secondary | ICD-10-CM | POA: Diagnosis not present

## 2018-03-30 DIAGNOSIS — R21 Rash and other nonspecific skin eruption: Secondary | ICD-10-CM | POA: Diagnosis not present

## 2018-03-31 DIAGNOSIS — D352 Benign neoplasm of pituitary gland: Secondary | ICD-10-CM | POA: Diagnosis not present

## 2018-03-31 DIAGNOSIS — E663 Overweight: Secondary | ICD-10-CM | POA: Diagnosis not present

## 2018-03-31 DIAGNOSIS — E7800 Pure hypercholesterolemia, unspecified: Secondary | ICD-10-CM | POA: Diagnosis not present

## 2018-04-08 DIAGNOSIS — M9903 Segmental and somatic dysfunction of lumbar region: Secondary | ICD-10-CM | POA: Diagnosis not present

## 2018-04-08 DIAGNOSIS — M9902 Segmental and somatic dysfunction of thoracic region: Secondary | ICD-10-CM | POA: Diagnosis not present

## 2018-04-08 DIAGNOSIS — M62830 Muscle spasm of back: Secondary | ICD-10-CM | POA: Diagnosis not present

## 2018-04-08 DIAGNOSIS — M9905 Segmental and somatic dysfunction of pelvic region: Secondary | ICD-10-CM | POA: Diagnosis not present

## 2018-04-22 DIAGNOSIS — M9902 Segmental and somatic dysfunction of thoracic region: Secondary | ICD-10-CM | POA: Diagnosis not present

## 2018-04-22 DIAGNOSIS — M62830 Muscle spasm of back: Secondary | ICD-10-CM | POA: Diagnosis not present

## 2018-04-22 DIAGNOSIS — M9905 Segmental and somatic dysfunction of pelvic region: Secondary | ICD-10-CM | POA: Diagnosis not present

## 2018-04-22 DIAGNOSIS — M9903 Segmental and somatic dysfunction of lumbar region: Secondary | ICD-10-CM | POA: Diagnosis not present

## 2018-07-10 DIAGNOSIS — Z23 Encounter for immunization: Secondary | ICD-10-CM | POA: Diagnosis not present

## 2018-08-31 ENCOUNTER — Other Ambulatory Visit: Payer: Self-pay | Admitting: Endocrinology

## 2018-09-08 ENCOUNTER — Ambulatory Visit (INDEPENDENT_AMBULATORY_CARE_PROVIDER_SITE_OTHER): Payer: BC Managed Care – PPO | Admitting: Endocrinology

## 2018-09-08 ENCOUNTER — Encounter: Payer: Self-pay | Admitting: Endocrinology

## 2018-09-08 VITALS — BP 108/66 | HR 58 | Ht 62.0 in | Wt 144.8 lb

## 2018-09-08 DIAGNOSIS — E221 Hyperprolactinemia: Secondary | ICD-10-CM | POA: Diagnosis not present

## 2018-09-08 LAB — TSH: TSH: 2.09 u[IU]/mL (ref 0.35–4.50)

## 2018-09-08 LAB — T4, FREE: Free T4: 0.85 ng/dL (ref 0.60–1.60)

## 2018-09-08 NOTE — Patient Instructions (Signed)
We can hold off on the MRI for now.  blood tests are requested for you today.  We'll let you know about the results.  Please come back for a follow-up appointment in 1 year.

## 2018-09-08 NOTE — Progress Notes (Signed)
Subjective:    Patient ID: Tina Martin, female    DOB: Oct 18, 1965, 53 y.o.   MRN: [id number]  HPI Pt returns for f/u of pituitary microadenoma and hyperprolactinemia (dx'ed 5102; uncertain relationship between the two; her husband has had a vasectomy; last MRI was in 2017, which showed the tumor was slightly smaller).  She takes cabergoline as rx'ed.  pt states she feels well in general, except for slight headache.  Past Medical History:  Diagnosis Date  . Diverticulitis   . History of abnormal cervical Pap smear   . Hyperprolactinemia ([place])   . Menorrhagia   . Nephrolithiasis   . Pituitary adenoma [facility])     Past Surgical History:  Procedure Laterality Date  . DILITATION & CURRETTAGE/HYSTROSCOPY WITH HYDROTHERMAL ABLATION N/A 04/11/2015   Procedure: DILATATION & CURETTAGE/HYSTEROSCOPY WITH HYDROTHERMAL ABLATION;  Surgeon: Dian Queen, MD;  Location: [facility];  Service: Gynecology;  Laterality: N/A;  . UMBILICAL HERNIA REPAIR  05-18-2010    Social History   Socioeconomic History  . Marital status: Married    Spouse name: Not on file  . Number of children: Not on file  . Years of education: Not on file  . Highest education level: Not on file  Occupational History  . Occupation: Financial controller    CommentVisual merchandiser  Social Needs  . Financial resource strain: Not on file  . Food insecurity:    Worry: Not on file    Inability: Not on file  . Transportation needs:    Medical: Not on file    Non-medical: Not on file  Tobacco Use  . Smoking status: Never Smoker  . Smokeless tobacco: Never Used  Substance and Sexual Activity  . Alcohol use: Yes    Alcohol/week: 0.0 standard drinks    Comment: occasional  . Drug use: Not on file  . Sexual activity: Not on file  Lifestyle  . Physical activity:    Days per week: Not on file    Minutes per session: Not on file  . Stress: Not on file  Relationships  . Social connections:    Talks on phone: Not on file      Gets together: Not on file    Attends religious service: Not on file    Active member of club or organization: Not on file    Attends meetings of clubs or organizations: Not on file    Relationship status: Not on file  . Intimate partner violence:    Fear of current or ex partner: Not on file    Emotionally abused: Not on file    Physically abused: Not on file    Forced sexual activity: Not on file  Other Topics Concern  . 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 cabergoline (DOSTINEX) 0.5 MG tablet TAKE 1 TABLET TWICE WEEKLY NEED OFFICE VISIT 25 tablet 3   No current facility-administered medications on file prior to visit.     Allergies  Allergen Reactions  . Chlorhexidine Gluconate Rash  . Latex Rash  . Tetracyclines & Related Rash    Severe     Family History  Problem Relation Age of Onset  . Hypertension Mother   . Hyperlipidemia Mother     BP 108/66 (BP Location: Left Arm, Patient Position: Sitting, Cuff Size:  Normal)   Pulse (!) 58   Ht 5\' 2"  (1.575 m)   Wt 144 lb 12.8 oz (65.7 kg)   SpO2 97%   BMI 26.48 kg/m   Review of Systems She has monthly menses.     Objective:   Physical Exam VITAL SIGNS:  See vs page GENERAL: no distress NECK: There is no palpable thyroid enlargement.  No thyroid nodule is palpable.  No palpable lymphadenopathy at the anterior neck.      Assessment & Plan:  Hyperprolactinemia: due for recheck. Pituitary adenoma: we discussed.  We can hold off on repeat MRI for now.  Patient Instructions  We can hold off on the MRI for now.  blood tests are requested for you today.  We'll let you know about the results.  Please come back for a follow-up appointment in 1 year.

## 2018-09-09 ENCOUNTER — Other Ambulatory Visit: Payer: Self-pay | Admitting: Endocrinology

## 2018-09-09 LAB — PROLACTIN: PROLACTIN: 47.9 ng/mL — AB

## 2018-09-09 MED ORDER — CABERGOLINE 0.5 MG PO TABS: 1.0000 mg | ORAL_TABLET | ORAL | 3 refills | Status: DC

## 2018-09-16 ENCOUNTER — Encounter: Payer: Self-pay | Admitting: Endocrinology

## 2018-09-24 DIAGNOSIS — Z01419 Encounter for gynecological examination (general) (routine) without abnormal findings: Secondary | ICD-10-CM | POA: Diagnosis not present

## 2018-09-24 DIAGNOSIS — Z1231 Encounter for screening mammogram for malignant neoplasm of breast: Secondary | ICD-10-CM | POA: Diagnosis not present

## 2018-09-24 DIAGNOSIS — Z6826 Body mass index (BMI) 26.0-26.9, adult: Secondary | ICD-10-CM | POA: Diagnosis not present

## 2018-09-25 DIAGNOSIS — Z23 Encounter for immunization: Secondary | ICD-10-CM | POA: Diagnosis not present

## 2019-01-23 DIAGNOSIS — Z03818 Encounter for observation for suspected exposure to other biological agents ruled out: Secondary | ICD-10-CM | POA: Diagnosis not present

## 2019-05-27 ENCOUNTER — Telehealth: Payer: Self-pay | Admitting: Family Medicine

## 2019-05-27 NOTE — Telephone Encounter (Signed)
Returned call to patient left message to call back to schedule an appointment with Dr Junius Roads  for tennis elbow

## 2019-05-28 ENCOUNTER — Ambulatory Visit (INDEPENDENT_AMBULATORY_CARE_PROVIDER_SITE_OTHER): Payer: BC Managed Care – PPO | Admitting: Family Medicine

## 2019-05-28 ENCOUNTER — Ambulatory Visit: Payer: BC Managed Care – PPO

## 2019-05-28 ENCOUNTER — Other Ambulatory Visit: Payer: Self-pay

## 2019-05-28 ENCOUNTER — Encounter: Payer: Self-pay | Admitting: Family Medicine

## 2019-05-28 DIAGNOSIS — M25522 Pain in left elbow: Secondary | ICD-10-CM | POA: Diagnosis not present

## 2019-05-28 MED ORDER — NITROGLYCERIN 0.1 MG/HR TD PT24: MEDICATED_PATCH | TRANSDERMAL | 3 refills | Status: DC

## 2019-05-28 NOTE — Progress Notes (Signed)
Office Visit Note   Patient: Tina Martin           Date of Birth: 03/10/1966           MRN: [id number] Visit Date: 05/28/2019 Requested by: Stephens Shire, MD [address] [place],  [place] [address] PCP: Stephens Shire, MD  Subjective: Chief Complaint  Patient presents with  . Left Elbow - Pain    HPI: She is here with left elbow pain.  She is right-hand dominant.  She noticed pain about 2 weeks ago, no definite injury.  She works out on a regular basis and it is painful when she lifts.  She went to her chiropractor, Dr. Kern Reap, earlier this week and had some therapeutic ultrasound which gave some improvement.  Denies any numbness in the hand, she is not taking medication for her pain.  No previous problems with her elbow.  Pain is on the lateral aspect.               ROS: No fevers or chills.  All other systems were reviewed and are negative.  Objective: Vital Signs: There were no vitals taken for this visit.  Physical Exam:  General:  Alert and oriented, in no acute distress. Pulm:  Breathing unlabored. Psy:  Normal mood, congruent affect. Skin: No rash on the skin. Left elbow: Full active range of motion, including forearm pronation and supination.  No joint effusion, no warmth to the touch.  She is point tender at the common extensor tendon at the lateral epicondyle.  No significant pain at the radial tunnel.  She has pain with wrist extension, forearm pronation against resistance.   Imaging: Limited diagnostic ultrasound left elbow: There is a intrasubstance partial tear of the common extensor tendon with hyperemia on power Doppler imaging.  There is a spur at the attachment on the lateral condyle.  No other abnormality seen.   Assessment & Plan: 1.  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 -Continue with therapeutic ultrasound.  Tennis elbow strap.  Nitroglycerin patch therapy.  If symptoms persist, could try physical  therapy for iontophoresis, or a one-time cortisone injection or possibly prolotherapy with dextrose.     Procedures: No procedures performed  No notes on file     PMFS History: Patient Active Problem List   Diagnosis Date Noted  . Vitamin D deficiency 08/25/2014  . Encounter for long-term (current) use of other medications 01/03/2011  . PITUITARY ADENOMA 07/06/2008  . Hyperprolactinemia ([place]) 07/06/2008  . HEADACHE 07/06/2008  . PAP SMEAR, ABNORMAL 07/06/2008   Past Medical History:  Diagnosis Date  . Diverticulitis   . History of abnormal cervical Pap smear   . Hyperprolactinemia ([place])   . Menorrhagia   . Nephrolithiasis   . Pituitary adenoma ([place])     Family History  Problem Relation Age of Onset  . Hypertension Mother   . Hyperlipidemia Mother     Past Surgical History:  Procedure Laterality Date  . DILITATION & CURRETTAGE/HYSTROSCOPY WITH HYDROTHERMAL ABLATION N/A 04/11/2015   Procedure: DILATATION & CURETTAGE/HYSTEROSCOPY WITH HYDROTHERMAL ABLATION;  Surgeon: Dian Queen, MD;  Location: [facility];  Service: Gynecology;  Laterality: N/A;  . UMBILICAL HERNIA REPAIR  05-18-2010   Social History   Occupational History  . Occupation: Owner    CommentVisual merchandiser  Tobacco Use  . Smoking status: Never Smoker  . Smokeless tobacco: Never Used  Substance and Sexual Activity  . Alcohol use: Yes  Alcohol/week: 0.0 standard drinks    Comment: occasional  . Drug use: Not on file  . Sexual activity: Not on file

## 2019-05-28 NOTE — Progress Notes (Signed)
Left elbow pain for about a month. No known injury. Right hand dominant. ? Tennis elbow?

## 2019-06-16 ENCOUNTER — Ambulatory Visit: Payer: BC Managed Care – PPO | Admitting: Family Medicine

## 2019-06-22 ENCOUNTER — Encounter: Payer: Self-pay | Admitting: Family Medicine

## 2019-06-22 ENCOUNTER — Other Ambulatory Visit: Payer: Self-pay

## 2019-06-22 ENCOUNTER — Ambulatory Visit: Payer: Self-pay

## 2019-06-22 ENCOUNTER — Ambulatory Visit (INDEPENDENT_AMBULATORY_CARE_PROVIDER_SITE_OTHER): Payer: BC Managed Care – PPO | Admitting: Family Medicine

## 2019-06-22 DIAGNOSIS — M25522 Pain in left elbow: Secondary | ICD-10-CM | POA: Diagnosis not present

## 2019-06-22 NOTE — Progress Notes (Signed)
Office Visit Note   Patient: Tina Martin           Date of Birth: [birth year]           MRN: [id number] Visit Date: 06/22/2019 Requested by: Stephens Shire, MD [address] [place],  [place] [address] PCP: Stephens Shire, MD  Subjective: Chief Complaint  Patient presents with  . Left Elbow - Pain, Follow-up    Persistent pain in the elbow. Been using nitroglycerin patch and has had a couple of sessions of therapy with her chiropractor.    HPI: Here with persistent left elbow pain.  Nitroglycerin patch did not seem to help.  Chiropractic treatments give her temporary relief.  Denies any numbness in her hand, the pain remains on the lateral elbow.              ROS: No fevers or chills.  All other systems were reviewed and are negative.  Objective: Vital Signs: There were no vitals taken for this visit.  Physical Exam:  General:  Alert and oriented, in no acute distress. Pulm:  Breathing unlabored. Psy:  Normal mood, congruent affect. Skin: No rash or erythema. Left elbow: Full range of motion with flexion extension, forearm pronation and supination.  Point tender at the common extensor tendon at the lateral epicondyle.  No tenderness at the radial tunnel today.  Moderate pain with wrist extension and third finger extension against resistance as well as forearm pronation and supination.  Imaging: None today other than for needle guidance  Assessment & Plan: 1.  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  Follow-up in 2 to 3 weeks for second of probably 3-5 treatments. -Avoid anti-inflammatories for the next couple weeks.    Procedures: Ultrasound-guided left elbow injection: After sterile prep with Betadine, injected a total of 3 cc of a 20% dextrose solution in 1% lidocaine using ultrasound to guide needle placement into the intrasubstance partial  tear of the common extensor tendon.  She had good relief during the immediate anesthetic phase.    PMFS History: Patient Active Problem List   Diagnosis Date Noted  . Vitamin D deficiency 08/25/2014  . Encounter for long-term (current) use of other medications 01/03/2011  . PITUITARY ADENOMA 07/06/2008  . Hyperprolactinemia ([place]) 07/06/2008  . HEADACHE 07/06/2008  . PAP SMEAR, ABNORMAL 07/06/2008   Past Medical History:  Diagnosis Date  . Diverticulitis   . History of abnormal cervical Pap smear   . Hyperprolactinemia ([place])   . Menorrhagia   . Nephrolithiasis   . Pituitary adenoma ([place])     Family History  Problem Relation Age of Onset  . Hypertension Mother   . Hyperlipidemia Mother     Past Surgical History:  Procedure Laterality Date  . DILITATION & CURRETTAGE/HYSTROSCOPY WITH HYDROTHERMAL ABLATION N/A 04/11/2015   Procedure: DILATATION & CURETTAGE/HYSTEROSCOPY WITH HYDROTHERMAL ABLATION;  Surgeon: Dian Queen, MD;  Location: [facility];  Service: Gynecology;  Laterality: N/A;  . UMBILICAL HERNIA REPAIR  05-18-2010   Social History   Occupational History  . Occupation: Owner    CommentVisual merchandiser  Tobacco Use  . Smoking status: Never Smoker  . Smokeless tobacco: Never Used  Substance and Sexual Activity  . Alcohol use: Yes    Alcohol/week: 0.0 standard drinks    Comment: occasional  . Drug use: Not on file  . Sexual activity: Not on file

## 2019-07-09 ENCOUNTER — Telehealth: Payer: Self-pay | Admitting: Family Medicine

## 2019-07-09 NOTE — Telephone Encounter (Signed)
Patient called asked for a call back to discuss the injection prior to her appointment on Monday. The number to contact patient is [phone]

## 2019-07-09 NOTE — Telephone Encounter (Signed)
I called and left a message on the patient's voice mail to either call back or send a message with specific issues/questions through [place].

## 2019-07-10 NOTE — Telephone Encounter (Signed)
Yes, that's definitely ok.

## 2019-07-10 NOTE — Telephone Encounter (Signed)
I left a message on the patient's voice mail - ok to wait to 4 weeks. I cancelled her appointment for this Monday. She can call back to reschedule at her convenience.

## 2019-07-10 NOTE — Telephone Encounter (Signed)
Is it ok to wait 4 weeks between the 1st and 2nd prolotherapy injections? She just does not want to be in a lot of pain the week of Thanksgiving. She has notice some improvement after the 1st injection.

## 2019-07-13 ENCOUNTER — Ambulatory Visit: Payer: BC Managed Care – PPO | Admitting: Family Medicine

## 2019-07-25 ENCOUNTER — Other Ambulatory Visit: Payer: Self-pay | Admitting: Endocrinology

## 2019-08-03 DIAGNOSIS — R109 Unspecified abdominal pain: Secondary | ICD-10-CM | POA: Diagnosis not present

## 2019-08-03 DIAGNOSIS — R1084 Generalized abdominal pain: Secondary | ICD-10-CM | POA: Diagnosis not present

## 2019-08-03 DIAGNOSIS — R3121 Asymptomatic microscopic hematuria: Secondary | ICD-10-CM | POA: Diagnosis not present

## 2019-08-03 DIAGNOSIS — N200 Calculus of kidney: Secondary | ICD-10-CM | POA: Diagnosis not present

## 2019-09-08 ENCOUNTER — Other Ambulatory Visit: Payer: Self-pay

## 2019-09-10 ENCOUNTER — Encounter: Payer: Self-pay | Admitting: Endocrinology

## 2019-09-10 ENCOUNTER — Other Ambulatory Visit: Payer: Self-pay

## 2019-09-10 ENCOUNTER — Ambulatory Visit: Payer: BC Managed Care – PPO | Admitting: Endocrinology

## 2019-09-10 ENCOUNTER — Ambulatory Visit: Payer: BLUE CROSS/BLUE SHIELD | Admitting: Endocrinology

## 2019-09-10 VITALS — BP 110/70 | HR 80 | Ht 62.0 in | Wt 151.8 lb

## 2019-09-10 DIAGNOSIS — D353 Benign neoplasm of craniopharyngeal duct: Secondary | ICD-10-CM | POA: Diagnosis not present

## 2019-09-10 DIAGNOSIS — D352 Benign neoplasm of pituitary gland: Secondary | ICD-10-CM | POA: Diagnosis not present

## 2019-09-10 LAB — BASIC METABOLIC PANEL
BUN: 16 mg/dL (ref 6–23)
CO2: 26 mEq/L (ref 19–32)
Calcium: 9.2 mg/dL (ref 8.4–10.5)
Chloride: 104 mEq/L (ref 96–112)
Creatinine, Ser: 0.82 mg/dL (ref 0.40–1.20)
GFR: 73.15 mL/min (ref >60.00)
Glucose, Bld: 100 mg/dL — ABNORMAL HIGH (ref 70–99)
Potassium: 4 mEq/L (ref 3.5–5.1)
Sodium: 137 mEq/L (ref 135–145)

## 2019-09-10 LAB — LUTEINIZING HORMONE: LH: 5.04 m[IU]/mL

## 2019-09-10 LAB — TSH: TSH: 1.72 u[IU]/mL (ref 0.35–4.50)

## 2019-09-10 LAB — T4, FREE: Free T4: 0.88 ng/dL (ref 0.60–1.60)

## 2019-09-10 LAB — FOLLICLE STIMULATING HORMONE: FSH: 5.5 m[IU]/mL

## 2019-09-10 NOTE — Patient Instructions (Signed)
Blood tests are requested for you today.  We'll let you know about the results.  Let's recheck the MRI.  you will receive a phone call, about a day and time for an appointment.  Please come back for a follow-up appointment in 1 year.

## 2019-09-10 NOTE — Progress Notes (Signed)
Subjective:    Patient ID: Tina Martin, female    DOB: 1966/03/08, 54 y.o.   MRN: [id number]  HPI Pt returns for f/u of pituitary microadenoma and hyperprolactinemia (dx'ed [id number]; uncertain relationship between the two; her husband has had a vasectomy; last MRI was in 2017, which showed the tumor was slightly smaller: other pit function tests were normal).  She takes cabergoline as rx'ed.  She reports pain at the roof of the mouth.  She has reg menses.  She asks if she can have labs checking for menopause.  Past Medical History:  Diagnosis Date  . Diverticulitis   . History of abnormal cervical Pap smear   . Hyperprolactinemia ([place])   . Menorrhagia   . Nephrolithiasis   . Pituitary adenoma [facility])     Past Surgical History:  Procedure Laterality Date  . DILITATION & CURRETTAGE/HYSTROSCOPY WITH HYDROTHERMAL ABLATION N/A 04/11/2015   Procedure: DILATATION & CURETTAGE/HYSTEROSCOPY WITH HYDROTHERMAL ABLATION;  Surgeon: Dian Queen, MD;  Location: [facility];  Service: Gynecology;  Laterality: N/A;  . UMBILICAL HERNIA REPAIR  05-18-2010    Social History   Socioeconomic History  . Marital status: Married    Spouse name: Not on file  . Number of children: Not on file  . Years of education: Not on file  . Highest education level: Not on file  Occupational History  . Occupation: Owner    CommentVisual merchandiser  Tobacco Use  . Smoking status: Never Smoker  . Smokeless tobacco: Never Used  Substance and Sexual Activity  . Alcohol use: Yes    Alcohol/week: 0.0 standard drinks    Comment: occasional  . Drug use: Not on file  . Sexual activity: Not on file  Other Topics Concern  . 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 Difficulty of Paying Living Expenses: Not on file  Food Insecurity:   . Worried About Paediatric nurse in the Last Year: Not on file  . Ran Out of Food in the Last Year: Not on file  Transportation Needs:   . Lack of Transportation (Medical): Not on file  . Lack of Transportation (Non-Medical): Not on file  Physical Activity:   . Days of Exercise per Week: Not on file  . Minutes of Exercise per Session: Not on file  Stress:   . Feeling of Stress : Not on file  Social Connections:   . Frequency of Communication with Friends and Family: Not on file  . Frequency of Social Gatherings with Friends and Family: Not on file  . Attends Religious Services: Not on file  . Active Member of Clubs or Organizations: Not on file  . Attends Archivist Meetings: Not on file  . Marital Status: Not on file  Intimate Partner Violence:   . Fear of Current or Ex-Partner: Not on file  . Emotionally Abused: Not on file  . Physically Abused: Not on file  . Sexually Abused: Not on file    Current Outpatient Medications on File Prior to Visit  Medication Sig Dispense Refill  . ALPRAZolam (XANAX) 0.5 MG tablet Take 0.5 mg by mouth 3 (three) times daily.    . cabergoline (DOSTINEX) 0.5 MG tablet TAKE 2 TABLETS BY MOUTH TWICE WEEKLY 50 tablet 3   No current facility-administered medications on file  prior to visit.    Allergies  Allergen Reactions  . Chlorhexidine Gluconate Rash  . Latex Rash  . Tetracyclines & Related Rash    Severe     Family History  Problem Relation Age of Onset  . Hypertension Mother   . Hyperlipidemia Mother     BP 110/70 (BP Location: Left Arm, Patient Position: Sitting, Cuff Size: Normal)   Pulse 80   Ht 5\' 2"  (1.575 m)   Wt 151 lb 12.8 oz (68.9 kg)   SpO2 98%   BMI 27.76 kg/m   Review of Systems Denies visual loss and polyuria.    Objective:   Physical Exam VITAL SIGNS:  See vs page GENERAL: no distress NECK: There is no palpable thyroid enlargement.  No thyroid nodule is palpable.  No palpable lymphadenopathy at the anterior neck.         Assessment & Plan:  Hyperprolactinemia: recheck today Headache: recheck MRI Menopausal age: check gonadotropins.  Patient Instructions  Blood tests are requested for you today.  We'll let you know about the results.  Let's recheck the MRI.  you will receive a phone call, about a day and time for an appointment.  Please come back for a follow-up appointment in 1 year.

## 2019-09-11 LAB — PROLACTIN: Prolactin: 56.1 ng/mL — ABNORMAL HIGH

## 2019-10-05 ENCOUNTER — Ambulatory Visit
Admission: RE | Admit: 2019-10-05 | Discharge: 2019-10-05 | Disposition: A | Discharge status: Home / Self Care | Payer: BC Managed Care – PPO | Source: Ambulatory Visit | Attending: Endocrinology | Admitting: Endocrinology

## 2019-10-05 ENCOUNTER — Other Ambulatory Visit: Payer: Self-pay

## 2019-10-05 ENCOUNTER — Encounter: Payer: Self-pay | Admitting: Endocrinology

## 2019-10-05 DIAGNOSIS — D352 Benign neoplasm of pituitary gland: Secondary | ICD-10-CM | POA: Diagnosis not present

## 2019-10-05 MED ORDER — GADOBENATE DIMEGLUMINE 529 MG/ML IV SOLN
7.0000 mL | Freq: Once | INTRAVENOUS | Status: AC | PRN
  Administered 2019-10-05: 7 mL via INTRAVENOUS

## 2019-10-07 ENCOUNTER — Other Ambulatory Visit: Payer: Self-pay | Admitting: Endocrinology

## 2019-10-07 MED ORDER — CABERGOLINE 0.5 MG PO TABS: 1.0000 mg | ORAL_TABLET | ORAL | 3 refills | Status: DC

## 2019-10-09 ENCOUNTER — Other Ambulatory Visit: Payer: Self-pay | Admitting: Endocrinology

## 2019-10-09 DIAGNOSIS — D353 Benign neoplasm of craniopharyngeal duct: Secondary | ICD-10-CM

## 2019-10-09 DIAGNOSIS — D352 Benign neoplasm of pituitary gland: Secondary | ICD-10-CM

## 2019-10-22 DIAGNOSIS — H00022 Hordeolum internum right lower eyelid: Secondary | ICD-10-CM | POA: Diagnosis not present

## 2019-10-22 DIAGNOSIS — D352 Benign neoplasm of pituitary gland: Secondary | ICD-10-CM | POA: Diagnosis not present

## 2019-10-26 DIAGNOSIS — Z1231 Encounter for screening mammogram for malignant neoplasm of breast: Secondary | ICD-10-CM | POA: Diagnosis not present

## 2019-10-26 DIAGNOSIS — Z6827 Body mass index (BMI) 27.0-27.9, adult: Secondary | ICD-10-CM | POA: Diagnosis not present

## 2019-10-26 DIAGNOSIS — Z13820 Encounter for screening for osteoporosis: Secondary | ICD-10-CM | POA: Diagnosis not present

## 2019-10-26 DIAGNOSIS — Z01419 Encounter for gynecological examination (general) (routine) without abnormal findings: Secondary | ICD-10-CM | POA: Diagnosis not present

## 2019-11-08 DIAGNOSIS — Z20828 Contact with and (suspected) exposure to other viral communicable diseases: Secondary | ICD-10-CM | POA: Diagnosis not present

## 2019-11-08 DIAGNOSIS — Z03818 Encounter for observation for suspected exposure to other biological agents ruled out: Secondary | ICD-10-CM | POA: Diagnosis not present

## 2019-12-24 DIAGNOSIS — R399 Unspecified symptoms and signs involving the genitourinary system: Secondary | ICD-10-CM | POA: Diagnosis not present

## 2019-12-24 DIAGNOSIS — D352 Benign neoplasm of pituitary gland: Secondary | ICD-10-CM | POA: Diagnosis not present

## 2020-01-11 DIAGNOSIS — M9905 Segmental and somatic dysfunction of pelvic region: Secondary | ICD-10-CM | POA: Diagnosis not present

## 2020-01-11 DIAGNOSIS — M62830 Muscle spasm of back: Secondary | ICD-10-CM | POA: Diagnosis not present

## 2020-01-11 DIAGNOSIS — M9903 Segmental and somatic dysfunction of lumbar region: Secondary | ICD-10-CM | POA: Diagnosis not present

## 2020-01-11 DIAGNOSIS — M9902 Segmental and somatic dysfunction of thoracic region: Secondary | ICD-10-CM | POA: Diagnosis not present

## 2020-01-19 ENCOUNTER — Telehealth: Payer: Self-pay | Admitting: Family Medicine

## 2020-01-19 NOTE — Telephone Encounter (Signed)
Patient called.   She wants to know if she can come in for blood work prior to her appointment. She is looking to establish primary care and would like the results before then   Call back: [phone]

## 2020-01-20 NOTE — Telephone Encounter (Signed)
I called and left this information on the patient's voice mail.   

## 2020-01-20 NOTE — Telephone Encounter (Signed)
I know we do not normally do blood work this way, but please advise.

## 2020-01-20 NOTE — Telephone Encounter (Signed)
It would be best to wait until the day of her appointment in case there are additional tests I want to order based on her history and symptoms.

## 2020-02-08 ENCOUNTER — Other Ambulatory Visit: Payer: Self-pay

## 2020-02-08 ENCOUNTER — Encounter: Payer: Self-pay | Admitting: Endocrinology

## 2020-02-08 ENCOUNTER — Ambulatory Visit (INDEPENDENT_AMBULATORY_CARE_PROVIDER_SITE_OTHER): Payer: BC Managed Care – PPO | Admitting: Endocrinology

## 2020-02-08 VITALS — BP 100/68 | HR 73 | Ht 62.0 in | Wt 153.2 lb

## 2020-02-08 DIAGNOSIS — E221 Hyperprolactinemia: Secondary | ICD-10-CM | POA: Diagnosis not present

## 2020-02-08 LAB — LUTEINIZING HORMONE: LH: 38.71 m[IU]/mL

## 2020-02-08 LAB — PROLACTIN: Prolactin: 47.7 ng/mL — ABNORMAL HIGH

## 2020-02-08 LAB — FOLLICLE STIMULATING HORMONE: FSH: 32.1 m[IU]/mL

## 2020-02-08 NOTE — Patient Instructions (Addendum)
Blood tests are requested for you today.  We'll let you know about the results.  Please come back for a follow-up appointment in 6 months.   

## 2020-02-08 NOTE — Progress Notes (Signed)
Subjective:    Patient ID: Tina Martin, female    DOB: 07-25-1966, 54 y.o.   MRN: [id number]  HPI Pt returns for f/u of pituitary microadenoma and hyperprolactinemia (dx'ed 2353; uncertain relationship between the two; her husband has had a vasectomy; last MRI was in 2021, which showed the tumor was slightly larger: other pit function tests were normal).  She takes cabergoline as rx'ed.  She has no menses x 6 weeks.   Past Medical History:  Diagnosis Date  . Diverticulitis   . History of abnormal cervical Pap smear   . Hyperprolactinemia ([place])   . Menorrhagia   . Nephrolithiasis   . Pituitary adenoma [facility])     Past Surgical History:  Procedure Laterality Date  . DILITATION & CURRETTAGE/HYSTROSCOPY WITH HYDROTHERMAL ABLATION N/A 04/11/2015   Procedure: DILATATION & CURETTAGE/HYSTEROSCOPY WITH HYDROTHERMAL ABLATION;  Surgeon: Dian Queen, MD;  Location: [facility];  Service: Gynecology;  Laterality: N/A;  . UMBILICAL HERNIA REPAIR  05-18-2010    Social History   Socioeconomic History  . Marital status: Married    Spouse name: Not on file  . Number of children: Not on file  . Years of education: Not on file  . Highest education level: Not on file  Occupational History  . Occupation: Owner    CommentVisual merchandiser  Tobacco Use  . Smoking status: Never Smoker  . Smokeless tobacco: Never Used  Substance and Sexual Activity  . Alcohol use: Yes    Alcohol/week: 0.0 standard drinks    Comment: occasional  . Drug use: Not on file  . Sexual activity: Not on file  Other Topics Concern  . 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 Difficulty of Paying Living Expenses:   Food Insecurity:   . Worried About Charity fundraiser in the Last Year:   . Arboriculturist in the Last Year:   Transportation Needs:   .  Film/video editor (Medical):   Marland Kitchen Lack of Transportation (Non-Medical):   Physical Activity:   . Days of Exercise per Week:   . Minutes of Exercise per Session:   Stress:   . Feeling of Stress :   Social Connections:   . Frequency of Communication with Friends and Family:   . Frequency of Social Gatherings with Friends and Family:   . Attends Religious Services:   . Active Member of Clubs or Organizations:   . Attends Archivist Meetings:   Marland Kitchen Marital Status:   Intimate Partner Violence:   . Fear of Current or Ex-Partner:   . Emotionally Abused:   Marland Kitchen Physically Abused:   . Sexually Abused:     Current Outpatient Medications on File Prior to Visit  Medication Sig Dispense Refill  . ALPRAZolam (XANAX) 0.5 MG tablet Take 0.5 mg by mouth 3 (three) times daily.    . cabergoline (DOSTINEX) 0.5 MG tablet Take 2 tablets (1 mg total) by mouth 3 (three) times a week. 75 tablet 3   No current facility-administered medications on file prior to visit.    Allergies  Allergen Reactions  . Chlorhexidine Gluconate Rash  . Latex Rash  . Tetracyclines & Related Rash    Severe     Family History  Problem Relation Age of Onset  . Hypertension Mother   .  Hyperlipidemia Mother     BP 100/68   Pulse 73   Ht 5\' 2"  (1.575 m)   Wt 153 lb 3.2 oz (69.5 kg)   SpO2 100%   BMI 28.02 kg/m    Review of Systems Denies headache.      Objective:   Physical Exam VITAL SIGNS:  See vs page GENERAL: no distress NECK: There is no palpable thyroid enlargement.  No thyroid nodule is palpable.  No palpable lymphadenopathy at the anterior neck.     Prolactin=48 FSH and LH are high    Assessment & Plan:  Hyperprolactinemia: persistent. Please continue the same medication Menopause, new.  I told pt that in this context, we are less concerned about the prolactin Pituitary adenoma: I told pt that she should f/u this PRN  Patient Instructions  Blood tests are requested for you today.   We'll let you know about the results.   Please come back for a follow-up appointment in 6 months.

## 2020-02-17 ENCOUNTER — Encounter: Payer: Self-pay | Admitting: Family Medicine

## 2020-02-17 ENCOUNTER — Other Ambulatory Visit: Payer: Self-pay

## 2020-02-17 ENCOUNTER — Ambulatory Visit (INDEPENDENT_AMBULATORY_CARE_PROVIDER_SITE_OTHER): Payer: BC Managed Care – PPO | Admitting: Family Medicine

## 2020-02-17 VITALS — BP 114/66 | HR 74 | Ht 62.0 in | Wt 149.4 lb

## 2020-02-17 DIAGNOSIS — E785 Hyperlipidemia, unspecified: Secondary | ICD-10-CM

## 2020-02-17 DIAGNOSIS — D352 Benign neoplasm of pituitary gland: Secondary | ICD-10-CM | POA: Diagnosis not present

## 2020-02-17 DIAGNOSIS — E559 Vitamin D deficiency, unspecified: Secondary | ICD-10-CM | POA: Diagnosis not present

## 2020-02-17 DIAGNOSIS — Z87442 Personal history of urinary calculi: Secondary | ICD-10-CM | POA: Insufficient documentation

## 2020-02-17 DIAGNOSIS — F439 Reaction to severe stress, unspecified: Secondary | ICD-10-CM

## 2020-02-17 DIAGNOSIS — Z0000 Encounter for general adult medical examination without abnormal findings: Secondary | ICD-10-CM

## 2020-02-17 DIAGNOSIS — G4709 Other insomnia: Secondary | ICD-10-CM

## 2020-02-17 NOTE — Progress Notes (Signed)
Office Visit Note   Patient: Tina Martin           Date of Birth: 05-11-1966           MRN: [id number] Visit Date: 02/17/2020 Requested by: Chesley Noon, MD [address] [place],  [place] [id number] PCP: Eunice Blase, MD  Subjective: Chief Complaint  Patient presents with  . establish primary care    HPI: She is here to establish care.  She is wanting to have a wellness examination.  Today she is feeling well with no major concerns.  She has a history of pituitary adenoma monitored by endocrinology.  Her adenoma has remained stable, and her prolactin levels have been fairly well controlled with Dostinex.  She has a history of kidney stones, she has had about 5 or 6 of them.  She had 1 this past year but is asymptomatic now.  She has fairly frequent urinary tract infections.  She is asymptomatic today.  She has been struggling with sleep for the past year and a half.  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  She is concerned that the issue is going to eventually go to court.  She has trouble sleeping at night due to her stress and has been using Xanax as needed.  She has never had troubles with sleep before.  She does note that she gets tired in the afternoons lately.  She was having a lot of joint pains in the last couple years, until she started taking some new supplements.  In particular she is using turmeric and NAC, and the symptoms make a big difference for her.  She also recently cut out dairy and noticed improvement in throat clearing.  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  It is not bothersome enough to warrant investigation, but she wanted me to be aware.  She has a history of vitamin D deficiency but is now on 10,000 IU daily.  Her levels have not been checked since 2017.  She had a colonoscopy 2 years ago which was normal.  She has annual GYN exams and mammograms.  She is now menopausal.  Family history  was reviewed in detail.                 ROS: Denies any current headaches, vision disturbance, gastrointestinal issues.  She does note some troubles with high-frequency hearing loss.  All other systems were reviewed and are negative.  Objective: Vital Signs: BP 114/66   Pulse 74   Ht 5\' 2"  (1.575 m)   Wt 149 lb 6.4 oz (67.8 kg)   BMI 27.33 kg/m   Physical Exam:  General:  Alert and oriented, in no acute distress. Pulm:  Breathing unlabored. Psy:  Normal mood, congruent affect. Skin: No suspicious lesions seen. HEENT:  [place]/AT, PERRLA, EOM Full, no nystagmus.  Funduscopic examination within normal limits.  No conjunctival erythema.  Tympanic membranes are pearly gray with normal landmarks.  External ear canals are normal.  Nasal passages are clear.  Oropharynx is clear.  No significant lymphadenopathy.  No thyromegaly or nodules.  2+ carotid pulses without bruits. CV: Regular rate and rhythm without murmurs, rubs, or gallops.  No peripheral edema.  2+ radial and posterior tibial pulses. Lungs: Clear to auscultation throughout with no wheezing or areas of consolidation. Abd: Bowel sounds are active, no hepatosplenomegaly or masses.  Soft and nontender.  No audible bruits.  No evidence of ascites. Extremities:  2+ upper and lower DTRs.  No nail deformities.   Imaging: No results found.  Assessment & Plan: 1.  Wellness examination -Labs to evaluate.  2.  Pituitary adenoma, stable and well controlled.  3.  History of hyperlipidemia -Recheck labs today.  4.  History of vitamin D deficiency -Recheck levels today.  5.  History of kidney stones  6.  Situational stress -We will add L-Theanine and phosphatidylserine  7.  History of multiple joint pains -I suggested trying a food elimination diet. -Could contemplate checking rheumatologic labs in the future if symptoms recur.    Procedures: No procedures performed  No notes on file     PMFS History: Patient Active Problem  List   Diagnosis Date Noted  . History of kidney stones 02/17/2020  . Need for tetanus, diphtheria, and acellular pertussis (Tdap) vaccine in patient of adolescent age or older 12/27/2015  . Vitamin D deficiency 08/25/2014  . Encounter for long-term (current) use of other medications 01/03/2011  . PITUITARY ADENOMA 07/06/2008  . Hyperprolactinemia ([facility]) 07/06/2008  . HEADACHE 07/06/2008  . PAP SMEAR, ABNORMAL 07/06/2008  . Pituitary adenoma ([facility]) 07/06/2008   Past Medical History:  Diagnosis Date  . Diverticulitis   . History of abnormal cervical Pap smear   . Hyperprolactinemia ([facility])   . Menorrhagia   . Nephrolithiasis   . Pituitary adenoma ([facility])     Family History  Problem Relation Age of Onset  . Hypertension Mother   . Hyperlipidemia Mother   . Heart disease Mother   . Hyperlipidemia Father   . Heart disease Father   . Cancer Father   . Skin cancer Father   . Stroke Maternal Grandmother        Smoker  . Cancer Maternal Grandfather   . Lung cancer Maternal Grandfather        smoker  . Lung cancer Paternal Grandfather        smoker  . Cancer Paternal Grandfather   . Breast cancer Neg Hx   . Colon cancer Neg Hx     Past Surgical History:  Procedure Laterality Date  . DILITATION & CURRETTAGE/HYSTROSCOPY WITH HYDROTHERMAL ABLATION N/A 04/11/2015   Procedure: DILATATION & CURETTAGE/HYSTEROSCOPY WITH HYDROTHERMAL ABLATION;  Surgeon: Dian Queen, MD;  Location: [facility];  Service: Gynecology;  Laterality: N/A;  . UMBILICAL HERNIA REPAIR  05-18-2010   Social History   Occupational History  . Occupation: Owner    CommentVisual merchandiser  Tobacco Use  . Smoking status: Never Smoker  . Smokeless tobacco: Never Used  Substance and Sexual Activity  . Alcohol use: Yes    Alcohol/week: 0.0 standard drinks    Comment: occasional  . Drug use: Not on file  . Sexual activity: Not on file

## 2020-02-19 ENCOUNTER — Encounter: Payer: Self-pay | Admitting: Family Medicine

## 2020-02-19 ENCOUNTER — Telehealth: Payer: Self-pay | Admitting: Family Medicine

## 2020-02-19 NOTE — Telephone Encounter (Signed)
Labs are notable for the following:  B12 level looks good at 478.  Metabolic panel is in normal range, but blood glucose is a bit higher than I would like at 96.  It is still not in prediabetes range, but it is very important to minimize intake of processed carbohydrates including breads, pastas, cereals, sugars and sweets.  Regular exercise is also beneficial.  Vitamin D level looks perfect at 77, and C-reactive protein, inflammation marker, is normal.  Lipid panel is borderline, but should improve with lifestyle changes mentioned above.  Complete blood count is abnormal once again with borderline low hemoglobin and MCHC.  This could indicate iron deficiency.  I will try to add some additional labs to the blood from yesterday.  If not, we might do some more testing in the near future.

## 2020-02-20 LAB — CBC WITH DIFFERENTIAL/PLATELET
Absolute Monocytes: 403 cells/uL (ref 200–950)
Basophils Absolute: 52 cells/uL (ref 0–200)
Basophils Relative: 0.8 %
Eosinophils Absolute: 52 cells/uL (ref 15–500)
Eosinophils Relative: 0.8 %
HCT: 36.6 % (ref 35.0–45.0)
Hemoglobin: 11.3 g/dL — ABNORMAL LOW (ref 11.7–15.5)
Lymphs Abs: 1619 cells/uL (ref 850–3900)
MCH: 30 pg (ref 27.0–33.0)
MCHC: 30.9 g/dL — ABNORMAL LOW (ref 32.0–36.0)
MCV: 97.1 fL (ref 80.0–100.0)
MPV: 11.5 fL (ref 7.5–12.5)
Monocytes Relative: 6.2 %
Neutro Abs: 4375 cells/uL (ref 1500–7800)
Neutrophils Relative %: 67.3 %
Platelets: 251 10*3/uL (ref 140–400)
RBC: 3.77 10*6/uL — ABNORMAL LOW (ref 3.80–5.10)
RDW: 12.4 % (ref 11.0–15.0)
Total Lymphocyte: 24.9 %
WBC: 6.5 10*3/uL (ref 3.8–10.8)

## 2020-02-20 LAB — IRON,TIBC AND FERRITIN PANEL
%SAT: 25 % (calc) (ref 16–45)
Ferritin: 32 ng/mL (ref 16–232)
Iron: 91 ug/dL (ref 45–160)
TIBC: 365 mcg/dL (calc) (ref 250–450)

## 2020-02-20 LAB — COMPREHENSIVE METABOLIC PANEL
AG Ratio: 1.9 (calc) (ref 1.0–2.5)
ALT: 11 U/L (ref 6–29)
AST: 15 U/L (ref 10–35)
Albumin: 4.4 g/dL (ref 3.6–5.1)
Alkaline phosphatase (APISO): 54 U/L (ref 37–153)
BUN: 10 mg/dL (ref 7–25)
CO2: 24 mmol/L (ref 20–32)
Calcium: 9.3 mg/dL (ref 8.6–10.4)
Chloride: 102 mmol/L (ref 98–110)
Creat: 0.8 mg/dL (ref 0.50–1.05)
Globulin: 2.3 g/dL (calc) (ref 1.9–3.7)
Glucose, Bld: 96 mg/dL (ref 65–99)
Potassium: 4.1 mmol/L (ref 3.5–5.3)
Sodium: 136 mmol/L (ref 135–146)
Total Bilirubin: 0.6 mg/dL (ref 0.2–1.2)
Total Protein: 6.7 g/dL (ref 6.1–8.1)

## 2020-02-20 LAB — LIPID PANEL
Cholesterol: 202 mg/dL — ABNORMAL HIGH (ref <200)
HDL: 75 mg/dL (ref >=50)
LDL Cholesterol (Calc): 108 mg/dL (calc) — ABNORMAL HIGH
Non-HDL Cholesterol (Calc): 127 mg/dL (calc) (ref <130)
Total CHOL/HDL Ratio: 2.7 (calc) (ref <5.0)
Triglycerides: 101 mg/dL (ref <150)

## 2020-02-20 LAB — VITAMIN D 25 HYDROXY (VIT D DEFICIENCY, FRACTURES): Vit D, 25-Hydroxy: 77 ng/mL (ref 30–100)

## 2020-02-20 LAB — HIGH SENSITIVITY CRP: hs-CRP: 2 mg/L

## 2020-02-20 LAB — VITAMIN B12: Vitamin B-12: 478 pg/mL (ref 200–1100)

## 2020-02-23 ENCOUNTER — Telehealth: Payer: Self-pay | Admitting: Family Medicine

## 2020-02-23 NOTE — Telephone Encounter (Signed)
Labs now released.

## 2020-03-21 DIAGNOSIS — Z20828 Contact with and (suspected) exposure to other viral communicable diseases: Secondary | ICD-10-CM | POA: Diagnosis not present

## 2020-03-24 ENCOUNTER — Ambulatory Visit (INDEPENDENT_AMBULATORY_CARE_PROVIDER_SITE_OTHER): Payer: BC Managed Care – PPO | Admitting: Physician Assistant

## 2020-03-24 ENCOUNTER — Encounter: Payer: Self-pay | Admitting: Physician Assistant

## 2020-03-24 ENCOUNTER — Other Ambulatory Visit: Payer: Self-pay

## 2020-03-24 VITALS — Ht 62.0 in | Wt 149.0 lb

## 2020-03-24 DIAGNOSIS — M79631 Pain in right forearm: Secondary | ICD-10-CM

## 2020-03-24 NOTE — Progress Notes (Signed)
Office Visit Note   Patient: Tina Martin           Date of Birth: [birth year]           MRN: [id number] Visit Date: 03/24/2020              Requested by: Eunice Blase, MD [address] [place],  [place] [address] PCP: Eunice Blase, MD  Chief Complaint  Patient presents with  . Right Forearm - Pain      HPI: This is a pleasant 54 year old woman with a chief complaint of a painful knot on her right forearm.  She denies any injuries or any change in activities.  She does workout at the gym on a regular basis.  She also was weeding several days ago.  She does not recall getting bit by a anything or coming into contact with anything.  She did not really notice much of a spot on her arm until she hit it yesterday and it was painful.  Since then she believes that it is gotten a little bit bigger.  She denies any fever chills she does have a superficial burn near the area but this is not painful in this is in its final stages of healing  Assessment & Plan: Visit Diagnoses: No diagnosis found.  Plan: Findings most consistent possible insect bite possible contact dermatitis could remotely be tearing of her muscle.  We will have her apply some hydrocortisone cream topically to see if this helps with her symptoms if she does not improve we would recommend an MRI with contrast  Follow-Up Instructions: No follow-ups on file.   Ortho Exam  Patient is alert, oriented, no adenopathy, well-dressed, normal affect, normal respiratory effort. Focused examination she has no cellulitis she has a minor burn that is almost completely healed she is not tender but adjacent to this on her forearm is an area of moderately painful mass about the size of a marble.  Overlying skin is mildly irritated.  There is no breaks in the skin.  No ascending cellulitis no fluctuance no evidence of acute infection  Imaging: No results found. No images are attached to the encounter.  Labs: No results found for:  HGBA1C, ESRSEDRATE, CRP, LABURIC, REPTSTATUS, GRAMSTAIN, CULT, LABORGA   Lab Results  Component Value Date   ALBUMIN 4.2 03/13/2012    No results found for: MG Lab Results  Component Value Date   VD25OH 77 02/17/2020   VD25OH 38.35 11/23/2015   VD25OH 21.44 (L) 07/20/2015    No results found for: PREALBUMIN CBC EXTENDED Latest Ref Rng & Units 02/17/2020 04/06/2015 05/16/2010  WBC 3.8 - 10.8 Thousand/uL 6.5 6.5 5.9  RBC 3.80 - 5.10 Million/uL 3.77(L) 3.86(L) 4.17  HGB 11.7 - 15.5 g/dL 11.3(L) 11.3(L) 12.6  HCT 35 - 45 % 36.6 34.5(L) 37.5  PLT 140 - 400 Thousand/uL 251 272 243  NEUTROABS 1,500 - 7,800 cells/uL 4,375 - 3.2  LYMPHSABS 850 - 3,900 cells/uL 1,619 - 2.1     Body mass index is 27.25 kg/m.  Orders:  No orders of the defined types were placed in this encounter.  No orders of the defined types were placed in this encounter.    Procedures: No procedures performed  Clinical Data: No additional findings.  ROS:  All other systems negative, except as noted in the HPI. Review of Systems  Objective: Vital Signs: Ht 5\' 2"  (1.575 m)   Wt 149 lb (67.6 kg)   BMI 27.25 kg/m   Specialty Comments:  No specialty comments available.  PMFS History: Patient Active Problem List   Diagnosis Date Noted  . History of kidney stones 02/17/2020  . Situational stress 02/17/2020  . Need for tetanus, diphtheria, and acellular pertussis (Tdap) vaccine in patient of adolescent age or older 12/27/2015  . Vitamin D deficiency 08/25/2014  . Encounter for long-term (current) use of other medications 01/03/2011  . PITUITARY ADENOMA 07/06/2008  . Hyperprolactinemia ([facility]) 07/06/2008  . HEADACHE 07/06/2008  . PAP SMEAR, ABNORMAL 07/06/2008  . Pituitary adenoma ([facility]) 07/06/2008   Past Medical History:  Diagnosis Date  . Diverticulitis   . History of abnormal cervical Pap smear   . Hyperprolactinemia ([facility])   . Menorrhagia   . Nephrolithiasis   . Pituitary adenoma ([facility])       Family History  Problem Relation Age of Onset  . Hypertension Mother   . Hyperlipidemia Mother   . Heart disease Mother   . Hyperlipidemia Father   . Heart disease Father   . Cancer Father   . Skin cancer Father   . Stroke Maternal Grandmother        Smoker  . Cancer Maternal Grandfather   . Lung cancer Maternal Grandfather        smoker  . Lung cancer Paternal Grandfather        smoker  . Cancer Paternal Grandfather   . Breast cancer Neg Hx   . Colon cancer Neg Hx     Past Surgical History:  Procedure Laterality Date  . DILITATION & CURRETTAGE/HYSTROSCOPY WITH HYDROTHERMAL ABLATION N/A 04/11/2015   Procedure: DILATATION & CURETTAGE/HYSTEROSCOPY WITH HYDROTHERMAL ABLATION;  Surgeon: Dian Queen, MD;  Location: [facility];  Service: Gynecology;  Laterality: N/A;  . UMBILICAL HERNIA REPAIR  05-18-2010   Social History   Occupational History  . Occupation: Owner    CommentVisual merchandiser  Tobacco Use  . Smoking status: Never Smoker  . Smokeless tobacco: Never Used  Substance and Sexual Activity  . Alcohol use: Yes    Alcohol/week: 0.0 standard drinks    Comment: occasional  . Drug use: Not on file  . Sexual activity: Not on file

## 2020-05-11 DIAGNOSIS — M9905 Segmental and somatic dysfunction of pelvic region: Secondary | ICD-10-CM | POA: Diagnosis not present

## 2020-05-11 DIAGNOSIS — M62830 Muscle spasm of back: Secondary | ICD-10-CM | POA: Diagnosis not present

## 2020-05-11 DIAGNOSIS — M9903 Segmental and somatic dysfunction of lumbar region: Secondary | ICD-10-CM | POA: Diagnosis not present

## 2020-05-11 DIAGNOSIS — M9902 Segmental and somatic dysfunction of thoracic region: Secondary | ICD-10-CM | POA: Diagnosis not present

## 2020-05-23 DIAGNOSIS — Z20828 Contact with and (suspected) exposure to other viral communicable diseases: Secondary | ICD-10-CM | POA: Diagnosis not present

## 2020-06-08 DIAGNOSIS — M9905 Segmental and somatic dysfunction of pelvic region: Secondary | ICD-10-CM | POA: Diagnosis not present

## 2020-06-08 DIAGNOSIS — M9903 Segmental and somatic dysfunction of lumbar region: Secondary | ICD-10-CM | POA: Diagnosis not present

## 2020-06-08 DIAGNOSIS — M9902 Segmental and somatic dysfunction of thoracic region: Secondary | ICD-10-CM | POA: Diagnosis not present

## 2020-06-08 DIAGNOSIS — M62830 Muscle spasm of back: Secondary | ICD-10-CM | POA: Diagnosis not present

## 2020-07-12 DIAGNOSIS — Z20828 Contact with and (suspected) exposure to other viral communicable diseases: Secondary | ICD-10-CM | POA: Diagnosis not present

## 2020-08-05 ENCOUNTER — Encounter: Payer: Self-pay | Admitting: Endocrinology

## 2020-08-05 ENCOUNTER — Ambulatory Visit (INDEPENDENT_AMBULATORY_CARE_PROVIDER_SITE_OTHER): Payer: BC Managed Care – PPO | Admitting: Endocrinology

## 2020-08-05 ENCOUNTER — Other Ambulatory Visit: Payer: Self-pay

## 2020-08-05 VITALS — BP 116/83 | HR 88 | Ht 62.0 in | Wt 153.0 lb

## 2020-08-05 DIAGNOSIS — E221 Hyperprolactinemia: Secondary | ICD-10-CM

## 2020-08-05 LAB — LUTEINIZING HORMONE: LH: 4.04 m[IU]/mL

## 2020-08-05 LAB — T4, FREE: Free T4: 0.85 ng/dL (ref 0.60–1.60)

## 2020-08-05 LAB — FOLLICLE STIMULATING HORMONE: FSH: 6.3 m[IU]/mL

## 2020-08-05 LAB — TSH: TSH: 1.99 u[IU]/mL (ref 0.35–4.50)

## 2020-08-05 NOTE — Patient Instructions (Signed)
Blood tests are requested for you today.  We'll let you know about the results.  Please come back for a follow-up appointment in 6 months.   

## 2020-08-05 NOTE — Progress Notes (Signed)
Subjective:    Patient ID: Tina Martin, female    DOB: May 09, 1966, 54 y.o.   MRN: [id number]  HPI Pt returns for f/u of pituitary microadenoma and hyperprolactinemia (dx'ed 7209; uncertain relationship between the two; her husband has had a vasectomy; last MRI was in 2021, which showed the tumor was slightly larger: other pit function tests were normal).  Pt says she does not miss the cabergoline. She has no menses x 6 weeks.   Past Medical History:  Diagnosis Date  . Diverticulitis   . History of abnormal cervical Pap smear   . Hyperprolactinemia ([facility])   . Menorrhagia   . Nephrolithiasis   . Pituitary adenoma [facility])     Past Surgical History:  Procedure Laterality Date  . DILITATION & CURRETTAGE/HYSTROSCOPY WITH HYDROTHERMAL ABLATION N/A 04/11/2015   Procedure: DILATATION & CURETTAGE/HYSTEROSCOPY WITH HYDROTHERMAL ABLATION;  Surgeon: Dian Queen, MD;  Location: [facility];  Service: Gynecology;  Laterality: N/A;  . UMBILICAL HERNIA REPAIR  05-18-2010    Social History   Socioeconomic History  . Marital status: Married    Spouse name: Not on file  . Number of children: Not on file  . Years of education: Not on file  . Highest education level: Not on file  Occupational History  . Occupation: Owner    CommentVisual merchandiser  Tobacco Use  . Smoking status: Never Smoker  . Smokeless tobacco: Never Used  Substance and Sexual Activity  . Alcohol use: Yes    Alcohol/week: 0.0 standard drinks    Comment: occasional  . Drug use: Not on file  . Sexual activity: Not on file  Other Topics Concern  . 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 ALPRAZolam (XANAX) 0.5 MG tablet Take 0.5 mg by mouth 3 (three) times daily.    . cabergoline (DOSTINEX) 0.5 MG tablet Take 2 tablets (1 mg total) by mouth 3 (three) times a week. 75 tablet 3   No current facility-administered medications on file prior to visit.    Allergies  Allergen Reactions  . Chlorhexidine Gluconate Rash  . Latex Rash  . Tetracyclines & Related Rash    Severe     Family History  Problem Relation Age of Onset  . Hypertension Mother   . Hyperlipidemia Mother   . Heart disease Mother   . Hyperlipidemia Father   . Heart disease Father   . Cancer Father   . Skin cancer Father   . Stroke Maternal Grandmother        Smoker  . Cancer Maternal Grandfather   . Lung cancer Maternal Grandfather        smoker  . Lung cancer Paternal Grandfather        smoker  . Cancer Paternal Grandfather   . Breast cancer Neg Hx   . Colon cancer Neg Hx     BP 116/83   Pulse 88   Ht 5\' 2"  (1.575 m)   Wt 153 lb (69.4 kg)   SpO2  98%   BMI 27.98 kg/m    Review of Systems She reports reg menses.    Objective:   Physical Exam VITAL SIGNS:  See vs page GENERAL: no distress NECK: There is no palpable thyroid enlargement.  No thyroid nodule is palpable.  No palpable lymphadenopathy at the anterior neck.   Prolactin=37     Assessment & Plan:  Hyperprolactinemia: uncontrolled.  Please continue the same cabergoline.   Amenorrhea.  Check labs Please come back for a follow-up appointment in 6 months.

## 2020-08-06 LAB — PROLACTIN: Prolactin: 37.3 ng/mL — ABNORMAL HIGH

## 2020-08-07 ENCOUNTER — Encounter: Payer: Self-pay | Admitting: Endocrinology

## 2020-08-09 ENCOUNTER — Ambulatory Visit: Payer: BC Managed Care – PPO | Admitting: Endocrinology

## 2020-08-15 DIAGNOSIS — Z03818 Encounter for observation for suspected exposure to other biological agents ruled out: Secondary | ICD-10-CM | POA: Diagnosis not present

## 2020-08-15 DIAGNOSIS — Z20822 Contact with and (suspected) exposure to covid-19: Secondary | ICD-10-CM | POA: Diagnosis not present

## 2020-09-12 ENCOUNTER — Ambulatory Visit: Payer: BC Managed Care – PPO | Admitting: Endocrinology

## 2020-10-04 DIAGNOSIS — Z20828 Contact with and (suspected) exposure to other viral communicable diseases: Secondary | ICD-10-CM | POA: Diagnosis not present

## 2020-11-10 ENCOUNTER — Other Ambulatory Visit: Payer: Self-pay | Admitting: Endocrinology

## 2020-11-14 DIAGNOSIS — R0982 Postnasal drip: Secondary | ICD-10-CM | POA: Diagnosis not present

## 2020-11-14 DIAGNOSIS — R059 Cough, unspecified: Secondary | ICD-10-CM | POA: Diagnosis not present

## 2020-11-14 DIAGNOSIS — R519 Headache, unspecified: Secondary | ICD-10-CM | POA: Diagnosis not present

## 2020-11-14 DIAGNOSIS — J029 Acute pharyngitis, unspecified: Secondary | ICD-10-CM | POA: Diagnosis not present

## 2020-11-14 DIAGNOSIS — Z20822 Contact with and (suspected) exposure to covid-19: Secondary | ICD-10-CM | POA: Diagnosis not present

## 2020-11-28 DIAGNOSIS — Z20822 Contact with and (suspected) exposure to covid-19: Secondary | ICD-10-CM | POA: Diagnosis not present

## 2020-11-28 DIAGNOSIS — Z03818 Encounter for observation for suspected exposure to other biological agents ruled out: Secondary | ICD-10-CM | POA: Diagnosis not present

## 2020-12-21 DIAGNOSIS — Z6827 Body mass index (BMI) 27.0-27.9, adult: Secondary | ICD-10-CM | POA: Diagnosis not present

## 2020-12-21 DIAGNOSIS — R635 Abnormal weight gain: Secondary | ICD-10-CM | POA: Diagnosis not present

## 2020-12-21 DIAGNOSIS — Z01419 Encounter for gynecological examination (general) (routine) without abnormal findings: Secondary | ICD-10-CM | POA: Diagnosis not present

## 2020-12-21 DIAGNOSIS — Z1231 Encounter for screening mammogram for malignant neoplasm of breast: Secondary | ICD-10-CM | POA: Diagnosis not present

## 2021-01-26 DIAGNOSIS — Z13220 Encounter for screening for lipoid disorders: Secondary | ICD-10-CM | POA: Diagnosis not present

## 2021-01-26 DIAGNOSIS — Z131 Encounter for screening for diabetes mellitus: Secondary | ICD-10-CM | POA: Diagnosis not present

## 2021-02-10 ENCOUNTER — Other Ambulatory Visit: Payer: Self-pay

## 2021-02-10 ENCOUNTER — Ambulatory Visit (INDEPENDENT_AMBULATORY_CARE_PROVIDER_SITE_OTHER): Payer: BC Managed Care – PPO | Admitting: Endocrinology

## 2021-02-10 VITALS — BP 110/70 | HR 67 | Ht 62.0 in | Wt 154.6 lb

## 2021-02-10 DIAGNOSIS — E221 Hyperprolactinemia: Secondary | ICD-10-CM | POA: Diagnosis not present

## 2021-02-10 DIAGNOSIS — N911 Secondary amenorrhea: Secondary | ICD-10-CM | POA: Diagnosis not present

## 2021-02-10 LAB — LUTEINIZING HORMONE: LH: 29.41 m[IU]/mL

## 2021-02-10 LAB — FOLLICLE STIMULATING HORMONE: FSH: 84.2 m[IU]/mL

## 2021-02-10 LAB — PROLACTIN: Prolactin: 13.1 ng/mL

## 2021-02-10 LAB — TSH: TSH: 2.04 u[IU]/mL (ref 0.35–4.50)

## 2021-02-10 NOTE — Progress Notes (Signed)
Subjective:    Patient ID: Tina Martin, female    DOB: February 22, 1966, 55 y.o.   MRN: [id number]  HPI Pt returns for f/u of pituitary microadenoma and hyperprolactinemia (dx'ed 4098; uncertain relationship between the two) last MRI was in 2021, which showed the tumor was slightly larger (avg 68mm diameter): other pit function tests were normal).  Pt says she does not miss the cabergoline. She now as no menses x 2 mos.   Past Medical History:  Diagnosis Date   Diverticulitis    History of abnormal cervical Pap smear    Hyperprolactinemia ([facility])    Menorrhagia    Nephrolithiasis    Pituitary adenoma ([facility])     Past Surgical History:  Procedure Laterality Date   DILITATION & CURRETTAGE/HYSTROSCOPY WITH HYDROTHERMAL ABLATION N/A 04/11/2015   Procedure: DILATATION & CURETTAGE/HYSTEROSCOPY WITH HYDROTHERMAL ABLATION;  Surgeon: Dian Queen, MD;  Location: [facility];  Service: Gynecology;  Laterality: N/A;   UMBILICAL HERNIA REPAIR  05-18-2010    Social History   Socioeconomic History   Marital status: Married    Spouse name: Not on file   Number of children: Not on file   Years of education: Not on file   Highest education level: Not on file  Occupational History   Occupation: Owner    Comment: Systems developer  Tobacco Use   Smoking status: Never   Smokeless tobacco: Never  Substance and Sexual Activity   Alcohol use: Yes    Alcohol/week: 0.0 standard drinks    Comment: occasional   Drug use: Not on file   Sexual activity: Not on file  Other Topics Concern   Not on file  Social History Narrative   Child birth (1997 and 72)   [place]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     cabergoline (DOSTINEX) 0.5 MG tablet TAKE 2 TABLETS (1 MG TOTAL) BY MOUTH 3 (THREE) TIMES A WEEK. 75 tablet 3   No current facility-administered medications on file prior to visit.    Allergies  Allergen Reactions   Chlorhexidine Gluconate Rash   Latex Rash   Tetracyclines & Related Rash    Severe     Family History  Problem Relation Age of Onset   Hypertension Mother    Hyperlipidemia Mother    Heart disease Mother    Hyperlipidemia Father    Heart disease Father    Cancer Father    Skin cancer Father    Stroke Maternal Grandmother        Smoker   Cancer Maternal Grandfather    Lung cancer Maternal Grandfather        smoker   Lung cancer Paternal Grandfather        smoker   Cancer Paternal Grandfather    Breast cancer Neg Hx    Colon cancer Neg Hx     BP 110/70 (BP Location: Right Arm, Patient Position: Sitting, Cuff Size: Normal)   Pulse 67   Ht 5\' 2"  (1.575 m)   Wt 154  lb 9.6 oz (70.1 kg)   SpO2 97%   BMI 28.28 kg/m    Review of Systems Denies n/v/dizziness.     Objective:   Physical Exam GENERAL: no distress NECK: There is no palpable thyroid enlargement.  No thyroid nodule is palpable.  No palpable lymphadenopathy at the anterior neck.  Prolactin=13    Assessment & Plan:  Pituitary adenoma.  We discussed.  She declines MRI this year Hyperprolactinemia: well-controlled.  Please continue the same cabergoline.  Amenorrhea: check labs.

## 2021-02-10 NOTE — Patient Instructions (Signed)
Blood tests are requested for you today.  We'll let you know about the results.  Please come back for a follow-up appointment in 6 months.   

## 2021-02-11 ENCOUNTER — Encounter: Payer: Self-pay | Admitting: Endocrinology

## 2021-05-14 DIAGNOSIS — Z20822 Contact with and (suspected) exposure to covid-19: Secondary | ICD-10-CM | POA: Diagnosis not present

## 2021-08-03 ENCOUNTER — Ambulatory Visit (INDEPENDENT_AMBULATORY_CARE_PROVIDER_SITE_OTHER): Payer: BC Managed Care – PPO | Admitting: Endocrinology

## 2021-08-03 ENCOUNTER — Other Ambulatory Visit: Payer: Self-pay

## 2021-08-03 VITALS — BP 110/70 | HR 65 | Ht 62.0 in | Wt 159.4 lb

## 2021-08-03 DIAGNOSIS — E221 Hyperprolactinemia: Secondary | ICD-10-CM

## 2021-08-03 NOTE — Patient Instructions (Signed)
Blood tests are requested for you today.  We'll let you know about the results.  Please come back for a follow-up appointment in 6 months.   

## 2021-08-03 NOTE — Progress Notes (Signed)
Subjective:    Patient ID: Tina Martin, female    DOB: [birth year], 55 y.o.   MRN: [id number]  HPI Pt returns for f/u of pituitary microadenoma and hyperprolactinemia (dx'ed 1017; uncertain relationship between the two) last MRI was in 2021, which showed the tumor was slightly larger (avg 54mm diameter): other pit function tests were normal; GYN follows DEXA).  Pt says she does not miss the cabergoline. She has infrequent menses.   Past Medical History:  Diagnosis Date   Diverticulitis    History of abnormal cervical Pap smear    Hyperprolactinemia ([facility])    Menorrhagia    Nephrolithiasis    Pituitary adenoma ([facility])     Past Surgical History:  Procedure Laterality Date   DILITATION & CURRETTAGE/HYSTROSCOPY WITH HYDROTHERMAL ABLATION N/A 04/11/2015   Procedure: DILATATION & CURETTAGE/HYSTEROSCOPY WITH HYDROTHERMAL ABLATION;  Surgeon: Dian Queen, MD;  Location: [facility];  Service: Gynecology;  Laterality: N/A;   UMBILICAL HERNIA REPAIR  05-18-2010    Social History   Socioeconomic History   Marital status: Married    Spouse name: Not on file   Number of children: Not on file   Years of education: Not on file   Highest education level: Not on file  Occupational History   Occupation: Owner    Comment: Systems developer  Tobacco Use   Smoking status: Never   Smokeless tobacco: Never  Substance and Sexual Activity   Alcohol use: Yes    Alcohol/week: 0.0 standard drinks    Comment: occasional   Drug use: Not on file   Sexual activity: Not on file  Other Topics Concern   Not on file  Social History Narrative   Child birth (1996 and 28)   [place]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     cabergoline (DOSTINEX) 0.5 MG tablet TAKE 2 TABLETS (1 MG TOTAL) BY MOUTH 3 (THREE) TIMES A WEEK. 75 tablet 3   No current facility-administered medications on file prior to visit.    Allergies  Allergen Reactions   Chlorhexidine Gluconate Rash   Latex Rash   Tetracyclines & Related Rash    Severe     Family History  Problem Relation Age of Onset   Hypertension Mother    Hyperlipidemia Mother    Heart disease Mother    Hyperlipidemia Father    Heart disease Father    Cancer Father    Skin cancer Father    Stroke Maternal Grandmother        Smoker   Cancer Maternal Grandfather    Lung cancer Maternal Grandfather        smoker   Lung cancer Paternal Grandfather        smoker   Cancer Paternal Grandfather    Breast cancer Neg Hx    Colon cancer Neg Hx     BP 110/70    Pulse 65    Ht 5\' 2"  (1.575 m)    Wt 159 lb 6.4 oz (72.3 kg)  SpO2 98%    BMI 29.15 kg/m    Review of Systems Denies HA and visual loss    Objective:   Physical Exam VITAL SIGNS:  See vs page GENERAL: no distress NECK: There is no palpable thyroid enlargement.  No thyroid nodule is palpable.  No palpable lymphadenopathy at the anterior neck.   Prolactin=29    Assessment & Plan:  Pituitary adenoma.  We discussed f/u.  She declines MRI for now.  Hyperprolactinemia: well-controlled.  Please continue the same cabergoline

## 2021-08-04 LAB — PROLACTIN: Prolactin: 29.6 ng/mL

## 2021-08-19 IMAGING — MR MR HEAD WO/W CM
15 of 19 series · 34 of 48 positions shown · IV contrast (multihance)
Comparison: Brain MRI 07/02/2016 and earlier.

CLINICAL DATA: 53-year-old female with chronic pituitary
microadenoma.

EXAM:
MRI HEAD WITHOUT AND WITH CONTRAST
TECHNIQUE: Multiplanar, multiecho pulse sequences of the brain and surrounding
structures were obtained without and with intravenous contrast.
CONTRAST:  7mL MULTIHANCE GADOBENATE DIMEGLUMINE 529 MG/ML IV SOLN

[Series 2: T1 · sagittal · 5.0mm · 0.45mm/px · 3 of 21 slices shown]
[im 1/21]
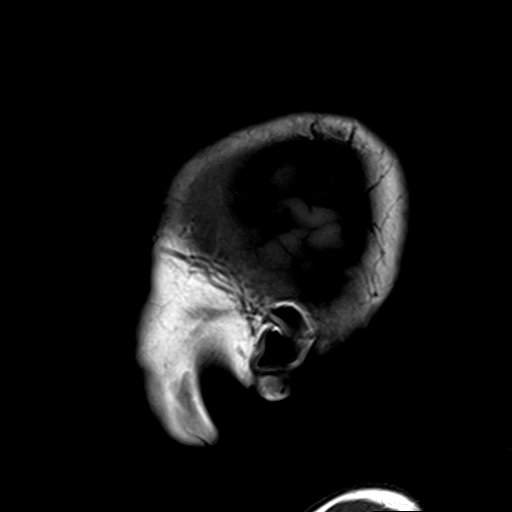
[im 11/21]
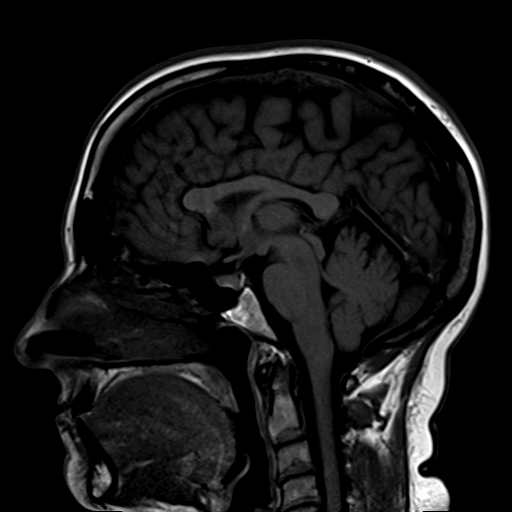
[im 21/21]
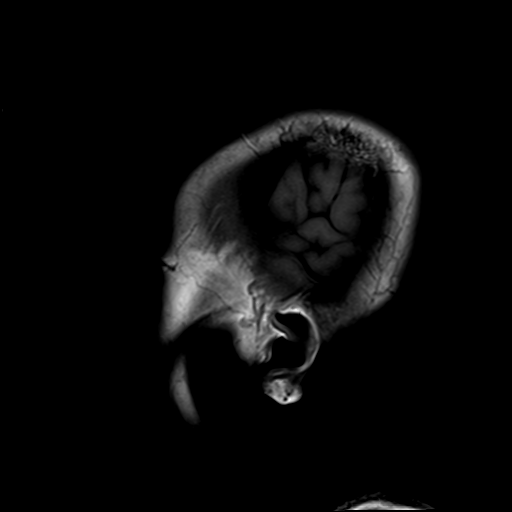

[Series 3: DWI · axial · 3.0mm · 1.80mm/px · z∈[-53,+94]mm · 8 of 99 slices shown]
[im 1/99]
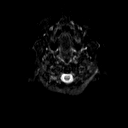
[im 11/99]
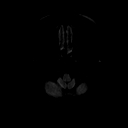
[im 33/99]
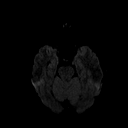
[im 44/99]
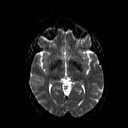
[im 55/99]
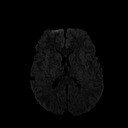
[im 66/99]
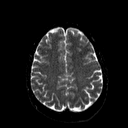
[im 88/99]
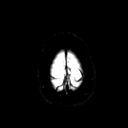
[im 99/99]
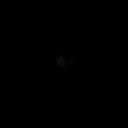

[Series 4: dwi_adc · axial · 3.0mm · 1.80mm/px · z∈[-53,+94]mm · 5 of 45 slices shown]
[im 1/45]
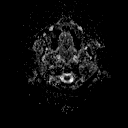
[im 12/45]
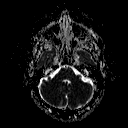
[im 23/45]
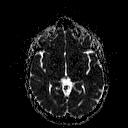
[im 34/45]
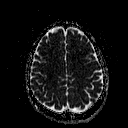
[im 45/45]
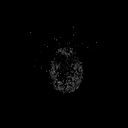

[Series 5: T2 · axial · 5.0mm · 0.45mm/px · z∈[-49,+88]mm · 2 of 22 slices shown]
[im 1/22]
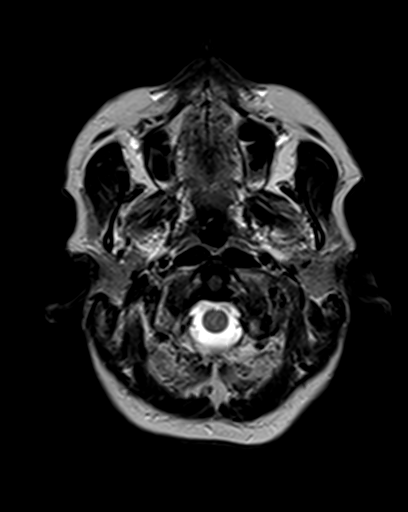
[im 22/22]
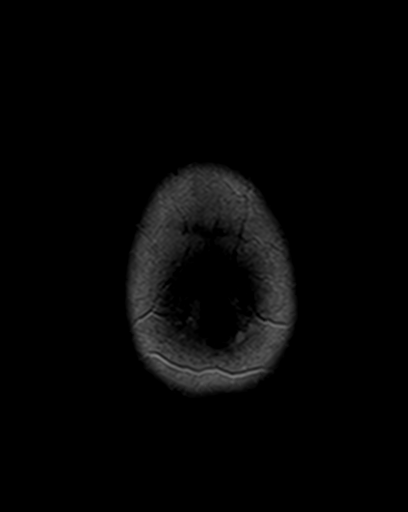

[Series 6: FLAIR · axial · 3.0mm · 0.45mm/px · z∈[-50,+90]mm · 3 of 31 slices shown]
[im 1/31]
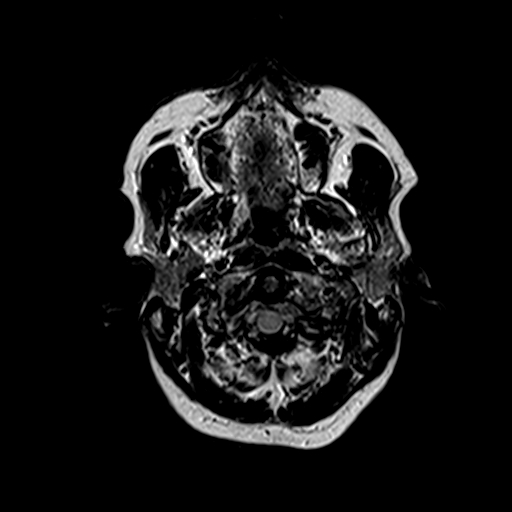
[im 16/31]
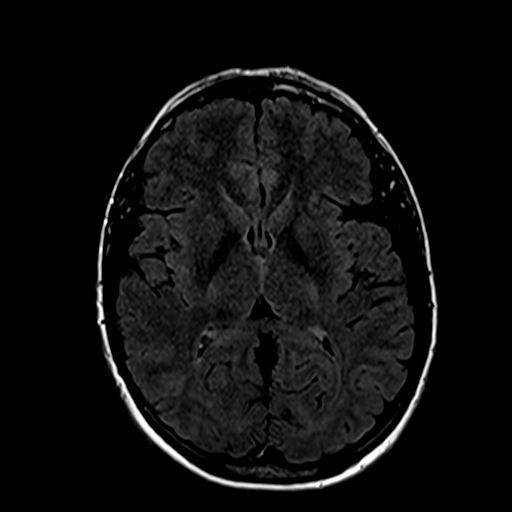
[im 31/31]
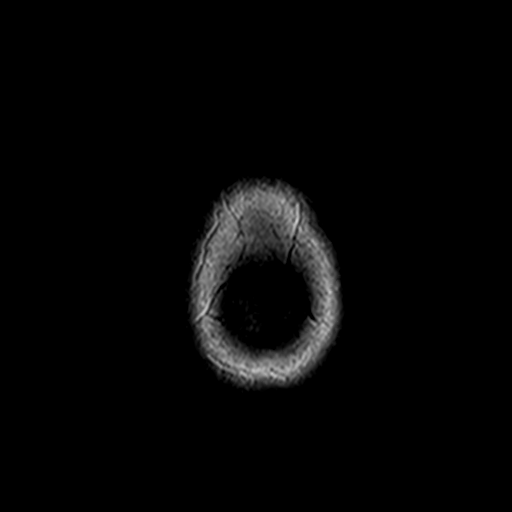

[Series 8: swi_images · axial · 4.0mm · 0.94mm/px · z∈[-50,+90]mm · 4 of 36 slices shown]
[im 1/36]
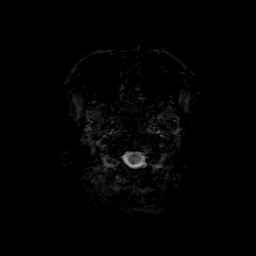
[im 12/36]
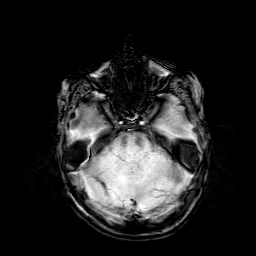
[im 24/36]
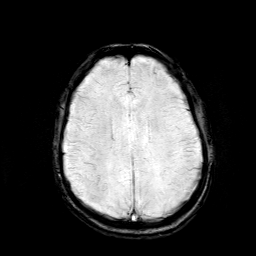
[im 36/36]
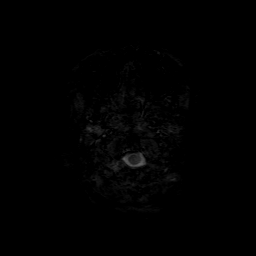

[Series 9: sag 3mm · sagittal · 3.0mm · 0.33mm/px · 1 of 13 slices shown]
[im 1/13]
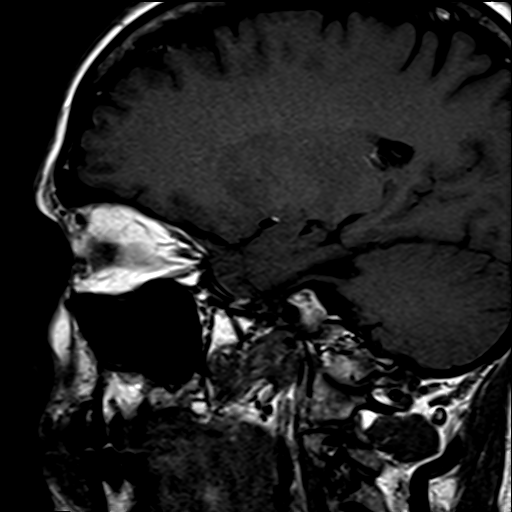

[Series 10: cor 3mm · coronal · 3.0mm · 0.33mm/px · 1 of 11 slices shown]
[im 1/11]
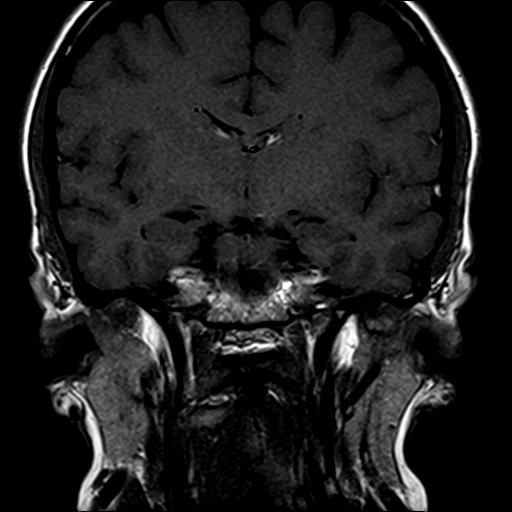

[Series 11: pre cor dynamic · coronal · non-contrast · 3.0mm · 0.39mm/px · 1 of 9 slices shown]
[im 1/9]
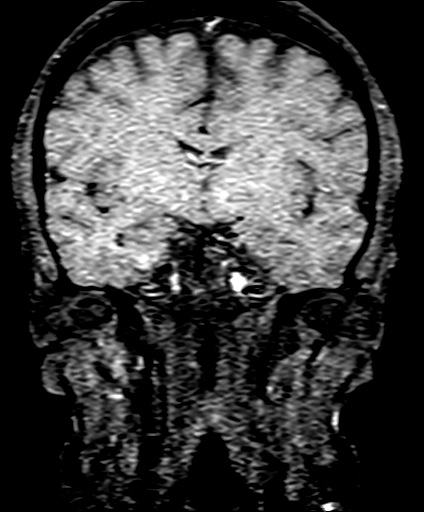

[Series 12: post fs cor · coronal · 3.0mm · 0.39mm/px · 1 of 9 slices shown (1 of 6)]
[im 1/9]
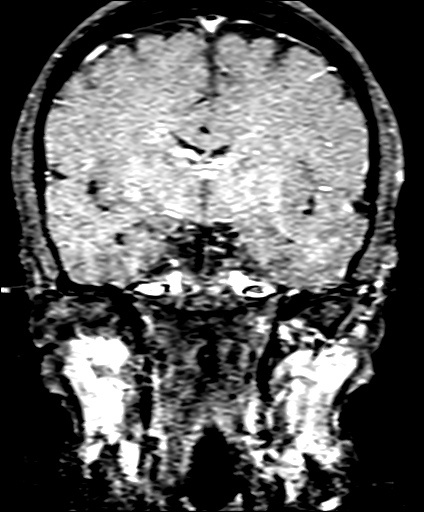

[Series 13: post fs cor · coronal · 3.0mm · 0.39mm/px · 1 of 9 slices shown (2 of 6)]
[im 1/9]
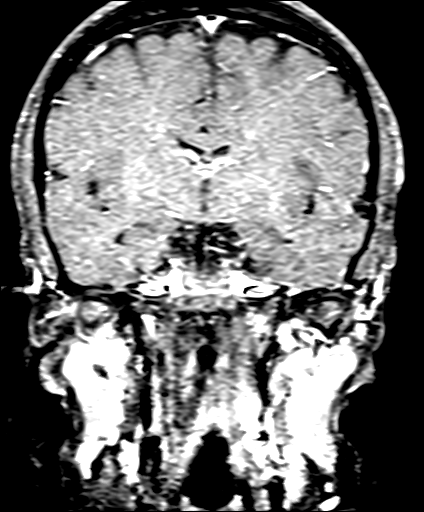

[Series 14: post fs cor · coronal · 3.0mm · 0.39mm/px · 1 of 9 slices shown (3 of 6)]
[im 1/9]
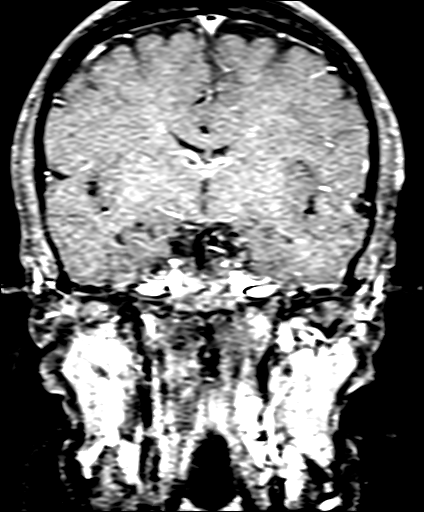

[Series 15: post fs cor · coronal · 3.0mm · 0.39mm/px · 1 of 9 slices shown (4 of 6)]
[im 1/9]
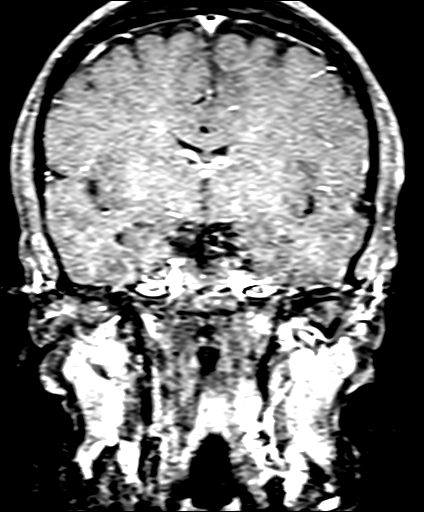

[Series 16: post fs cor · coronal · 3.0mm · 0.39mm/px · 1 of 9 slices shown (5 of 6)]
[im 1/9]
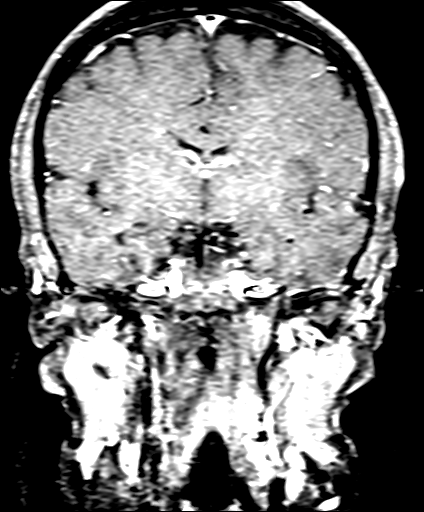

[Series 17: post fs cor · coronal · 3.0mm · 0.39mm/px · 1 of 9 slices shown (6 of 6)]
[im 1/9]
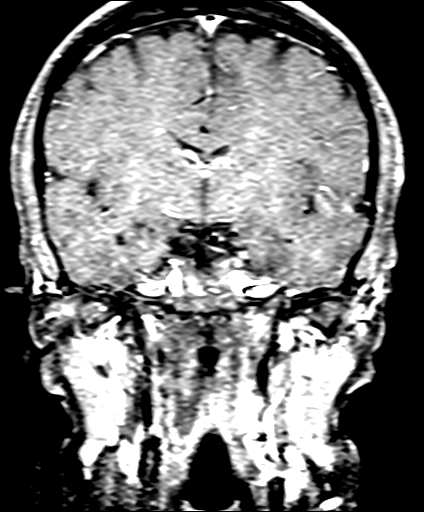

[34 of 48 positions shown; findings below may reference images not displayed]

FINDINGS: Brain: Pituitary details below. Normal cerebral volume. No
restricted diffusion to suggest acute infarction. No midline shift,
mass effect, evidence of mass lesion, ventriculomegaly, extra-axial
collection or acute intracranial hemorrhage. Cervicomedullary
junction within normal limits.

The Gray and white matter signal is within normal limits throughout
the brain. No encephalomalacia or chronic cerebral blood products
identified. No abnormal gray or white matter enhancement. No dural
thickening.

Vascular: Major intracranial vascular flow voids are stable.

Skull and upper cervical spine: Negative visible cervical spine.
Normal bone marrow signal.

Sinuses/Orbits: Orbits and paranasal sinuses appears stable and
negative. Mastoids remain clear. Visible internal auditory
structures appear normal. Scalp and face soft tissues appear
negative.

Other: Dedicated pituitary imaging. Stable overall pituitary size.
The suprasellar cistern remains patent. Chronic leftward deviation
of the infundibulum is stable. The hypothalamus remains normal.

An oval hypoenhancing soft tissue nodule located along the floor and
right lateral aspect of the sella today measures 10 by 9 by 5 mm (AP
by transverse by CC). This is slightly larger and was previously 9 x
7 x 5 mm in 3523. The right lateral aspect of the lesion now abuts
the cavernous sinus on series 19, image 6, but there is no overt
cavernous sinus invasion. The remainder of the gland is
homogeneously enhancing. The left cavernous sinus is normal. No
other abnormal enhancement identified.
IMPRESSION: 1. Chronic pituitary microadenoma has mildly enlarged since 3523 now
10 x 9 x 5 mm, versus 9 x 7 x 5 mm previously. The lesion abuts but
does not invade the right cavernous sinus. No suprasellar extension
or mass effect. Remaining pituitary appears stable.

2. Otherwise normal MRI appearance of the brain.

## 2021-11-17 ENCOUNTER — Ambulatory Visit (INDEPENDENT_AMBULATORY_CARE_PROVIDER_SITE_OTHER): Payer: BC Managed Care – PPO | Admitting: Endocrinology

## 2021-11-17 ENCOUNTER — Encounter: Payer: Self-pay | Admitting: Endocrinology

## 2021-11-17 VITALS — BP 116/76 | HR 74 | Ht 62.0 in | Wt 156.2 lb

## 2021-11-17 DIAGNOSIS — E221 Hyperprolactinemia: Secondary | ICD-10-CM | POA: Diagnosis not present

## 2021-11-17 DIAGNOSIS — D352 Benign neoplasm of pituitary gland: Secondary | ICD-10-CM | POA: Diagnosis not present

## 2021-11-17 NOTE — Patient Instructions (Addendum)
Blood tests are requested for you today.  We'll let you know about the results.   Let's recheck the MRI.  you will receive a phone call, about a day and time for an appointment.   Please come back for a follow-up appointment in 6 months.   

## 2021-11-17 NOTE — Progress Notes (Signed)
? ?Subjective:  ? ? Patient ID: Tina Martin, female    DOB: 10/15/1965, 56 y.o.   MRN: [id number] ? ?HPI ?Pt returns for f/u of pituitary microadenoma and hyperprolactinemia (dx'ed 8315; uncertain relationship between the two) last MRI was in 2021, which showed the tumor was slightly larger (avg 57m diameter): other pit function tests were normal; GYN follows DEXA; FSH and LH are menopausal).  Pt says she does not miss the cabergoline. She still has infrequent menses.  Pt says Dr HJunius Roadschecks TFT.   ?Past Medical History:  ?Diagnosis Date  ? Diverticulitis   ? History of abnormal cervical Pap smear   ? Hyperprolactinemia ([facility]   ? Menorrhagia   ? Nephrolithiasis   ? Pituitary adenoma ([facility]   ? ? ?Past Surgical History:  ?Procedure Laterality Date  ? DILITATION & CURRETTAGE/HYSTROSCOPY WITH HYDROTHERMAL ABLATION N/A 04/11/2015  ? Procedure: DILATATION & CURETTAGE/HYSTEROSCOPY WITH HYDROTHERMAL ABLATION;  Surgeon: MDian Queen MD;  [facility]  Service: Gynecology;  Laterality: N/A;  ? UMBILICAL HERNIA REPAIR  05-18-2010  ? ? ?Social History  ? ?Socioeconomic History  ? Marital status: Married  ?  Spouse name: Not on file  ? Number of children: Not on file  ? Years of education: Not on file  ? Highest education level: Not on file  ?Occupational History  ? Occupation: Owner  ?  Comment: [employer] developer ?Tobacco Use  ? Smoking status: Never  ? Smokeless tobacco: Never  ?Substance and Sexual Activity  ? Alcohol use: Yes  ?  Alcohol/week: 0.0 standard drinks  ?  Comment: occasional  ? Drug use: Not on file  ? Sexual activity: Not on file  ?Other Topics Concern  ? Not on file  ?Social History Narrative  ? Child birth (1997 and 2000)  ? 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 ?Social Determinants of Health  ? ?Financial Resource Strain: Not on file  ?Food Insecurity: Not on file  ?Transportation Needs: Not on file  ?Physical Activity: Not on file  ?Stress: Not on file  ?Social  Connections: Not on file  ?Intimate Partner Violence: Not on file  ? ? ?Current Outpatient Medications on File Prior to Visit  ?Medication Sig Dispense Refill  ? ALPRAZolam (XANAX) 0.5 MG tablet Take 0.5 mg by mouth 3 (three) times daily.    ? cabergoline (DOSTINEX) 0.5 MG tablet TAKE 2 TABLETS (1 MG TOTAL) BY MOUTH 3 (THREE) TIMES A WEEK. 75 tablet 3  ? ?No current facility-administered medications on file prior to visit.  ? ? ?Allergies  ?Allergen Reactions  ? Chlorhexidine Gluconate Rash  ? Latex Rash  ? Tetracyclines & Related Rash  ?  Severe   ? ? ?Family History  ?Problem Relation Age of Onset  ? Hypertension Mother   ? Hyperlipidemia Mother   ? Heart disease Mother   ? Hyperlipidemia Father   ? Heart disease Father   ? Cancer Father   ? Skin cancer Father   ? Stroke Maternal Grandmother   ?     Smoker  ? Cancer Maternal Grandfather   ? Lung cancer Maternal Grandfather   ?     smoker  ? Lung cancer Paternal Grandfather   ?     smoker  ? Cancer Paternal Grandfather   ? Breast cancer Neg Hx   ? Colon cancer Neg Hx   ? ? ?BP 116/76 (BP Location: Left Arm, Patient Position: Sitting, Cuff Size: Normal)   Pulse  74   Ht '5\' 2"'$  (1.575 m)   Wt 156 lb 3.2 oz (70.9 kg)   SpO2 96%   BMI 28.57 kg/m?  ? ? ?Review of Systems ? ?   ?Objective:  ? Physical Exam ?VITAL SIGNS:  See vs page ?GENERAL: no distress ? ? ? ?Prolactin=20 ?   ?Assessment & Plan:  ?Hyperprolactinemia: well-controlled.  Please continue the same cabergoline ?Pituitary adenoma: I requested recheck. ? ?

## 2021-11-18 LAB — PROLACTIN: Prolactin: 20.4 ng/mL

## 2022-02-01 DIAGNOSIS — M545 Low back pain, unspecified: Secondary | ICD-10-CM | POA: Diagnosis not present

## 2022-02-01 DIAGNOSIS — M41129 Adolescent idiopathic scoliosis, site unspecified: Secondary | ICD-10-CM | POA: Diagnosis not present

## 2022-02-07 DIAGNOSIS — M41129 Adolescent idiopathic scoliosis, site unspecified: Secondary | ICD-10-CM | POA: Diagnosis not present

## 2022-02-07 DIAGNOSIS — M545 Low back pain, unspecified: Secondary | ICD-10-CM | POA: Diagnosis not present

## 2022-02-14 ENCOUNTER — Ambulatory Visit: Payer: BC Managed Care – PPO | Admitting: Endocrinology

## 2022-02-15 DIAGNOSIS — M419 Scoliosis, unspecified: Secondary | ICD-10-CM | POA: Diagnosis not present

## 2022-02-19 DIAGNOSIS — Z13820 Encounter for screening for osteoporosis: Secondary | ICD-10-CM | POA: Diagnosis not present

## 2022-02-19 DIAGNOSIS — N958 Other specified menopausal and perimenopausal disorders: Secondary | ICD-10-CM | POA: Diagnosis not present

## 2022-02-19 DIAGNOSIS — Z01419 Encounter for gynecological examination (general) (routine) without abnormal findings: Secondary | ICD-10-CM | POA: Diagnosis not present

## 2022-02-19 DIAGNOSIS — Z6828 Body mass index (BMI) 28.0-28.9, adult: Secondary | ICD-10-CM | POA: Diagnosis not present

## 2022-02-19 DIAGNOSIS — M4126 Other idiopathic scoliosis, lumbar region: Secondary | ICD-10-CM | POA: Diagnosis not present

## 2022-02-19 DIAGNOSIS — Z1231 Encounter for screening mammogram for malignant neoplasm of breast: Secondary | ICD-10-CM | POA: Diagnosis not present

## 2022-02-19 DIAGNOSIS — Z8262 Family history of osteoporosis: Secondary | ICD-10-CM | POA: Diagnosis not present

## 2022-02-22 DIAGNOSIS — M419 Scoliosis, unspecified: Secondary | ICD-10-CM | POA: Diagnosis not present

## 2022-03-01 DIAGNOSIS — M419 Scoliosis, unspecified: Secondary | ICD-10-CM | POA: Diagnosis not present

## 2022-03-08 DIAGNOSIS — M419 Scoliosis, unspecified: Secondary | ICD-10-CM | POA: Diagnosis not present

## 2023-01-07 ENCOUNTER — Other Ambulatory Visit: Payer: Self-pay | Admitting: Family Medicine

## 2023-01-07 DIAGNOSIS — E221 Hyperprolactinemia: Secondary | ICD-10-CM

## 2023-01-07 DIAGNOSIS — D352 Benign neoplasm of pituitary gland: Secondary | ICD-10-CM

## 2023-01-29 DIAGNOSIS — D2272 Melanocytic nevi of left lower limb, including hip: Secondary | ICD-10-CM | POA: Diagnosis not present

## 2023-01-29 DIAGNOSIS — B353 Tinea pedis: Secondary | ICD-10-CM | POA: Diagnosis not present

## 2023-01-29 DIAGNOSIS — L821 Other seborrheic keratosis: Secondary | ICD-10-CM | POA: Diagnosis not present

## 2023-01-29 DIAGNOSIS — D225 Melanocytic nevi of trunk: Secondary | ICD-10-CM | POA: Diagnosis not present

## 2023-02-01 ENCOUNTER — Other Ambulatory Visit: Payer: BC Managed Care – PPO

## 2023-03-14 ENCOUNTER — Ambulatory Visit
Admission: RE | Admit: 2023-03-14 | Discharge: 2023-03-14 | Disposition: A | Discharge status: Home / Self Care | Payer: BC Managed Care – PPO | Source: Ambulatory Visit | Attending: Family Medicine | Admitting: Family Medicine

## 2023-03-14 DIAGNOSIS — E221 Hyperprolactinemia: Secondary | ICD-10-CM

## 2023-03-14 DIAGNOSIS — D352 Benign neoplasm of pituitary gland: Secondary | ICD-10-CM | POA: Diagnosis not present

## 2023-03-14 MED ORDER — GADOPICLENOL 0.5 MMOL/ML IV SOLN
10.0000 mL | Freq: Once | INTRAVENOUS | Status: AC | PRN
  Administered 2023-03-14: 10 mL via INTRAVENOUS

## 2023-03-19 DIAGNOSIS — Z6824 Body mass index (BMI) 24.0-24.9, adult: Secondary | ICD-10-CM | POA: Diagnosis not present

## 2023-03-19 DIAGNOSIS — Z01419 Encounter for gynecological examination (general) (routine) without abnormal findings: Secondary | ICD-10-CM | POA: Diagnosis not present

## 2023-03-19 DIAGNOSIS — Z1231 Encounter for screening mammogram for malignant neoplasm of breast: Secondary | ICD-10-CM | POA: Diagnosis not present

## 2024-06-10 ENCOUNTER — Telehealth: Payer: Self-pay | Admitting: Family Medicine

## 2024-06-10 NOTE — Telephone Encounter (Signed)
 Copied from CRM [id number]. Topic: Appointments - Scheduling Inquiry for Clinic >> Jun 09, 2024 11:44 AM Robinson H wrote: Reason for CRM: Patient is calling to schedule an appointment for Physical Therapy states her provider recommended due to her straining her achilles in heel. Not a current patient at [facility], outside provider  Nathalie [phone] >> Jun 09, 2024  4:48 PM Dedra B wrote: Pt called again to follow up on this matter. >> Jun 09, 2024  4:45 PM [place] J wrote: Patient is returning a call back for an update. I let the patient know that we are not a specialty practice and she would have to establish an appointment with us  in order to see a provider. I also relayed the number to [facility] to see if they could help her further.  Further action does not need to take place at this moment.

## 2024-06-10 NOTE — Telephone Encounter (Signed)
 We have not received a referral yet. Will call patient to advise

## 2024-06-15 ENCOUNTER — Ambulatory Visit
Admission: RE | Admit: 2024-06-15 | Discharge: 2024-06-15 | Disposition: A | Discharge status: Home / Self Care | Source: Ambulatory Visit | Attending: Family Medicine | Admitting: Family Medicine

## 2024-06-15 ENCOUNTER — Other Ambulatory Visit: Payer: Self-pay | Admitting: Family Medicine

## 2024-06-15 DIAGNOSIS — M79671 Pain in right foot: Secondary | ICD-10-CM

## 2024-06-17 ENCOUNTER — Other Ambulatory Visit: Payer: Self-pay | Admitting: Family Medicine

## 2024-06-17 DIAGNOSIS — E785 Hyperlipidemia, unspecified: Secondary | ICD-10-CM

## 2024-06-19 ENCOUNTER — Ambulatory Visit
Admission: RE | Admit: 2024-06-19 | Discharge: 2024-06-19 | Disposition: A | Discharge status: Home / Self Care | Payer: Self-pay | Source: Ambulatory Visit | Attending: Family Medicine | Admitting: Family Medicine

## 2024-06-19 DIAGNOSIS — E785 Hyperlipidemia, unspecified: Secondary | ICD-10-CM
